# Patient Record
Sex: Female | Born: 1965 | Race: White | Hispanic: No | Marital: Married | Smoking: Never smoker
Health system: Southern US, Community
[De-identification: ages and names within clinical notes are randomized; demographics above are authoritative.]

## PROBLEM LIST (undated history)

## (undated) DIAGNOSIS — C50919 Malignant neoplasm of unspecified site of unspecified female breast: Secondary | ICD-10-CM

## (undated) DIAGNOSIS — C801 Malignant (primary) neoplasm, unspecified: Secondary | ICD-10-CM

## (undated) DIAGNOSIS — Z9221 Personal history of antineoplastic chemotherapy: Secondary | ICD-10-CM

## (undated) DIAGNOSIS — Z923 Personal history of irradiation: Secondary | ICD-10-CM

## (undated) HISTORY — PX: BREAST ENHANCEMENT SURGERY: SHX7

## (undated) HISTORY — DX: Malignant (primary) neoplasm, unspecified: C80.1

## (undated) HISTORY — PX: AUGMENTATION MAMMAPLASTY: SUR837

## (undated) HISTORY — DX: Malignant neoplasm of unspecified site of unspecified female breast: C50.919

---

## 1995-05-12 HISTORY — PX: TUBAL LIGATION: SHX77

## 2004-12-09 ENCOUNTER — Encounter: Payer: Self-pay | Admitting: Unknown Physician Specialty

## 2006-04-21 ENCOUNTER — Ambulatory Visit: Payer: Self-pay

## 2006-04-26 ENCOUNTER — Ambulatory Visit: Payer: Self-pay

## 2012-02-03 ENCOUNTER — Ambulatory Visit: Payer: Self-pay | Admitting: Family Medicine

## 2013-05-11 DIAGNOSIS — C801 Malignant (primary) neoplasm, unspecified: Secondary | ICD-10-CM

## 2013-05-11 DIAGNOSIS — C50919 Malignant neoplasm of unspecified site of unspecified female breast: Secondary | ICD-10-CM

## 2013-05-11 HISTORY — DX: Malignant neoplasm of unspecified site of unspecified female breast: C50.919

## 2013-05-11 HISTORY — PX: BREAST BIOPSY: SHX20

## 2013-05-11 HISTORY — DX: Malignant (primary) neoplasm, unspecified: C80.1

## 2013-07-05 ENCOUNTER — Ambulatory Visit: Payer: Self-pay | Admitting: Family Medicine

## 2013-07-10 ENCOUNTER — Ambulatory Visit: Payer: Self-pay | Admitting: Family Medicine

## 2013-07-14 LAB — PATHOLOGY REPORT

## 2013-07-17 ENCOUNTER — Other Ambulatory Visit: Payer: BC Managed Care – PPO

## 2013-07-17 ENCOUNTER — Ambulatory Visit (INDEPENDENT_AMBULATORY_CARE_PROVIDER_SITE_OTHER): Payer: BC Managed Care – PPO | Admitting: General Surgery

## 2013-07-17 ENCOUNTER — Encounter: Payer: Self-pay | Admitting: General Surgery

## 2013-07-17 ENCOUNTER — Other Ambulatory Visit: Payer: Self-pay | Admitting: General Surgery

## 2013-07-17 ENCOUNTER — Ambulatory Visit: Payer: Self-pay | Admitting: Hematology and Oncology

## 2013-07-17 VITALS — BP 140/76 | Ht 59.0 in | Wt 150.0 lb

## 2013-07-17 DIAGNOSIS — Z853 Personal history of malignant neoplasm of breast: Secondary | ICD-10-CM | POA: Insufficient documentation

## 2013-07-17 DIAGNOSIS — C50919 Malignant neoplasm of unspecified site of unspecified female breast: Secondary | ICD-10-CM

## 2013-07-17 NOTE — Patient Instructions (Signed)
Patient to have labs drawn at [facility] lab today.  This patient has also been scheduled for a PET scan at [facility] for 07-20-13 at 8 am (arrive 7:45 am). She is aware of all instructions.  Patient's surgery has been scheduled for 07-24-13 at [facility].

## 2013-07-17 NOTE — Progress Notes (Signed)
Patient ID: Dawn Rivas, female   DOB: 1965-09-04, 48 y.o.   MRN: [id number]  Chief Complaint  Patient presents with  . Other    breast cancer    HPI Dawn Rivas is a 48 y.o. female  Here due to being diagnosed with breast cancer. She found the mass in her left breast during her monthly self breast exam.  She had a mammogram done at [facility] on 07/05/13, then a subsuquent biopsy of the left breast on 07/10/13. She has been referred here by Dolores Frame NP for surgical consult. She denies any pain or discharge from the breasts.  The patient is accompanied by her husband 18 months, Gene Wingard as well as her parents.  The patient reports no pain or discomfort of the site. There is no history of trauma.  She is the mother of 3 boys.  Sallye Lat, M.D. In [place] placed retropectoral prostheses 7 years ago.   HPI  No past medical history on file.  Past Surgical History  Procedure Laterality Date  . Tubal ligation  1997  . Breast enhancement surgery  7 years ago    Family History  Problem Relation Age of Onset  . Breast cancer Maternal Aunt   . Cervical cancer Mother   . Lung cancer Maternal Grandfather     Social History History  Substance Use Topics  . Smoking status: Never Smoker   . Smokeless tobacco: Never Used  . Alcohol Use: Yes    Allergies  Allergen Reactions  . Penicillins Hives  . Sulfur Hives    Current Outpatient Prescriptions  Medication Sig Dispense Refill  . ALPRAZolam (XANAX) 0.5 MG tablet Take 0.5 mg by mouth 2 (two) times daily as needed.        No current facility-administered medications for this visit.    Review of Systems Review of Systems  Constitutional: Negative.   Respiratory: Negative.   Cardiovascular: Negative.     Blood pressure 140/76, height _0  (1.499 m), weight 150 lb (68.04 kg).  Physical Exam Physical Exam  Constitutional: She is oriented to person, place, and time. She appears well-developed and well-nourished.  Neck:  Normal range of motion. Neck supple.  Cardiovascular: Normal rate, regular rhythm and normal heart sounds.   Pulmonary/Chest: Effort normal and breath sounds normal. Right breast exhibits no inverted nipple, no mass, no nipple discharge, no skin change and no tenderness. Left breast exhibits mass (2 cm mass 8 o'clock, 7 cm from the nipple). Left breast exhibits no inverted nipple, no nipple discharge, no skin change and no tenderness.    Lymphadenopathy:    She has axillary adenopathy.       Right axillary: No pectoral adenopathy present.       Left axillary: Pectoral adenopathy present.  Neurological: She is alert and oriented to person, place, and time.    Data Reviewed The primary tumor measures 1.4 x 2.1 x 2.26 cm in the left breast at the 8 o'clock position, 7 cm from the nipple. This abuts the underlying pectoralis fascia but does not move through the pectoralis muscle.  The left axilla shows a 1.97 x 2.4 x 2.9 cm mass consistent with a abnormal lymph gland.  Pathology showed invasive mammary carcinoma in both lesions. ER/TR/HER-2/neu negative.Laboratory studies completed by Dolores Frame, NP. On June 23, 2013 showed a hemoglobin of 15.5, white blood cell count 6000 with 54% polys, 40% lymphocytes, comprehensive metabolic panel obtained fasting showed a creatinine of 0.89 with an estimated GFR of 76,  normal electrolytes, normal liver function studies.  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       Plan    After the patient's exam I spoke with her husband and her parents. This is easily stage II disease with a large axillary lymph node. Triple negative tumors ward strong consideration for neoadjuvant chemotherapy. Considering the large node in the left axilla staging with PET/CT is appropriate. CEA and CA 27-29 will be obtained.  As she is under 71 and has a family history of breast cancer in maternal aunt she is a candidate for genetic screening  for BRCA mutations.  The role of adjuvant chemotherapy was discussed and the need for central venous access to successfully complete this was reviewed. The risks of power port placement including those related to bleeding and lung injury were discussed.  We'll arrange for medical oncology evaluation as well as PET scan in the near future. Arrangements have been made for power port placement on July 24, 2013.      Patient to have the following labs drawn at [facility] lab today: CEA and CA 27-29.  An appointment has been arranged for patient to see Dr. Kallie Edward at the [facility] for 07-18-13 at 9 am (arrive 8:30 am).  This patient has also been scheduled for a PET scan at [facility] for 07-20-13 at 8 am (arrive 7:45 am). She is aware of all instructions.  Patient's surgery has been scheduled for 07-24-13 at [facility].   Robert Bellow 07/17/2013, 7:21 PM

## 2013-07-18 ENCOUNTER — Ambulatory Visit: Payer: Self-pay | Admitting: Hematology and Oncology

## 2013-07-18 LAB — CEA: CEA: 1.4 ng/mL (ref 0.0–4.7)

## 2013-07-18 LAB — CANCER ANTIGEN 27.29: CA 27.29: 21.4 U/mL (ref 0.0–38.6)

## 2013-07-20 ENCOUNTER — Ambulatory Visit: Payer: Self-pay | Admitting: General Surgery

## 2013-07-24 ENCOUNTER — Ambulatory Visit: Payer: Self-pay | Admitting: General Surgery

## 2013-07-24 ENCOUNTER — Encounter: Payer: Self-pay | Admitting: General Surgery

## 2013-07-24 DIAGNOSIS — C50919 Malignant neoplasm of unspecified site of unspecified female breast: Secondary | ICD-10-CM

## 2013-07-24 HISTORY — PX: PORTACATH PLACEMENT: SHX2246

## 2013-07-26 ENCOUNTER — Encounter: Payer: Self-pay | Admitting: General Surgery

## 2013-07-28 LAB — CBC CANCER CENTER
BASOS PCT: 0.6 %
Basophil #: 0 x10 3/mm (ref 0.0–0.1)
EOS PCT: 2.2 %
Eosinophil #: 0.1 x10 3/mm (ref 0.0–0.7)
HCT: 42.2 % (ref 35.0–47.0)
HGB: 14.3 g/dL (ref 12.0–16.0)
LYMPHS ABS: 2.7 x10 3/mm (ref 1.0–3.6)
Lymphocyte %: 43.8 %
MCH: 31.2 pg (ref 26.0–34.0)
MCHC: 33.9 g/dL (ref 32.0–36.0)
MCV: 92 fL (ref 80–100)
Monocyte #: 0.4 x10 3/mm (ref 0.2–0.9)
Monocyte %: 6.5 %
Neutrophil #: 2.8 x10 3/mm (ref 1.4–6.5)
Neutrophil %: 46.9 %
Platelet: 176 x10 3/mm (ref 150–440)
RBC: 4.59 10*6/uL (ref 3.80–5.20)
RDW: 12.5 % (ref 11.5–14.5)
WBC: 6.1 x10 3/mm (ref 3.6–11.0)

## 2013-07-28 LAB — COMPREHENSIVE METABOLIC PANEL
ANION GAP: 10 (ref 7–16)
Albumin: 3.9 g/dL (ref 3.4–5.0)
Alkaline Phosphatase: 52 U/L
BUN: 18 mg/dL (ref 7–18)
Bilirubin,Total: 0.4 mg/dL (ref 0.2–1.0)
CREATININE: 1.01 mg/dL (ref 0.60–1.30)
Calcium, Total: 9 mg/dL (ref 8.5–10.1)
Chloride: 103 mmol/L (ref 98–107)
Co2: 27 mmol/L (ref 21–32)
EGFR (Non-African Amer.): >60
Glucose: 108 mg/dL — ABNORMAL HIGH (ref 65–99)
OSMOLALITY: 282 (ref 275–301)
Potassium: 3.9 mmol/L (ref 3.5–5.1)
SGOT(AST): 23 U/L (ref 15–37)
SGPT (ALT): 41 U/L (ref 12–78)
SODIUM: 140 mmol/L (ref 136–145)
Total Protein: 7.6 g/dL (ref 6.4–8.2)

## 2013-08-01 ENCOUNTER — Encounter: Payer: Self-pay | Admitting: General Surgery

## 2013-08-04 LAB — CBC CANCER CENTER
BASOS PCT: 0.7 %
Basophil #: 0 x10 3/mm (ref 0.0–0.1)
EOS ABS: 0.1 x10 3/mm (ref 0.0–0.7)
Eosinophil %: 2.3 %
HCT: 40.5 % (ref 35.0–47.0)
HGB: 13.8 g/dL (ref 12.0–16.0)
LYMPHS ABS: 1.7 x10 3/mm (ref 1.0–3.6)
LYMPHS PCT: 48.7 %
MCH: 31.3 pg (ref 26.0–34.0)
MCHC: 34 g/dL (ref 32.0–36.0)
MCV: 92 fL (ref 80–100)
Monocyte #: 0 x10 3/mm — ABNORMAL LOW (ref 0.2–0.9)
Monocyte %: 1.3 %
NEUTROS ABS: 1.6 x10 3/mm (ref 1.4–6.5)
Neutrophil %: 47 %
Platelet: 192 x10 3/mm (ref 150–440)
RBC: 4.41 10*6/uL (ref 3.80–5.20)
RDW: 11.8 % (ref 11.5–14.5)
WBC: 3.5 x10 3/mm — ABNORMAL LOW (ref 3.6–11.0)

## 2013-08-09 ENCOUNTER — Ambulatory Visit: Payer: Self-pay | Admitting: Hematology and Oncology

## 2013-08-11 LAB — CBC CANCER CENTER
Basophil #: 0 x10 3/mm (ref 0.0–0.1)
Basophil %: 1.5 %
Eosinophil #: 0 x10 3/mm (ref 0.0–0.7)
Eosinophil %: 1 %
HCT: 40.4 % (ref 35.0–47.0)
HGB: 13.9 g/dL (ref 12.0–16.0)
LYMPHS PCT: 68.7 %
Lymphocyte #: 1.4 x10 3/mm (ref 1.0–3.6)
MCH: 31.7 pg (ref 26.0–34.0)
MCHC: 34.3 g/dL (ref 32.0–36.0)
MCV: 93 fL (ref 80–100)
MONOS PCT: 18.7 %
Monocyte #: 0.4 x10 3/mm (ref 0.2–0.9)
NEUTROS PCT: 10.1 %
Neutrophil #: 0.2 x10 3/mm — ABNORMAL LOW (ref 1.4–6.5)
Platelet: 228 x10 3/mm (ref 150–440)
RBC: 4.37 10*6/uL (ref 3.80–5.20)
RDW: 12 % (ref 11.5–14.5)
WBC: 2 x10 3/mm — CL (ref 3.6–11.0)

## 2013-08-18 LAB — CBC CANCER CENTER
BASOS ABS: 0.1 x10 3/mm (ref 0.0–0.1)
BASOS PCT: 1.1 %
Eosinophil #: 0 x10 3/mm (ref 0.0–0.7)
Eosinophil %: 0.3 %
HCT: 40.5 % (ref 35.0–47.0)
HGB: 13.9 g/dL (ref 12.0–16.0)
LYMPHS ABS: 2.1 x10 3/mm (ref 1.0–3.6)
LYMPHS PCT: 37.1 %
MCH: 31.7 pg (ref 26.0–34.0)
MCHC: 34.2 g/dL (ref 32.0–36.0)
MCV: 93 fL (ref 80–100)
MONO ABS: 0.7 x10 3/mm (ref 0.2–0.9)
MONOS PCT: 11.8 %
NEUTROS ABS: 2.8 x10 3/mm (ref 1.4–6.5)
NEUTROS PCT: 49.7 %
PLATELETS: 279 x10 3/mm (ref 150–440)
RBC: 4.37 10*6/uL (ref 3.80–5.20)
RDW: 12.5 % (ref 11.5–14.5)
WBC: 5.7 x10 3/mm (ref 3.6–11.0)

## 2013-08-18 LAB — BASIC METABOLIC PANEL
ANION GAP: 9 (ref 7–16)
BUN: 14 mg/dL (ref 7–18)
CHLORIDE: 103 mmol/L (ref 98–107)
CREATININE: 0.92 mg/dL (ref 0.60–1.30)
Calcium, Total: 9.2 mg/dL (ref 8.5–10.1)
Co2: 27 mmol/L (ref 21–32)
GLUCOSE: 98 mg/dL (ref 65–99)
Osmolality: 278 (ref 275–301)
Potassium: 4.1 mmol/L (ref 3.5–5.1)
Sodium: 139 mmol/L (ref 136–145)

## 2013-08-25 LAB — CBC CANCER CENTER
BASOS ABS: 0 x10 3/mm (ref 0.0–0.1)
Basophil %: 1.1 %
Eosinophil #: 0 x10 3/mm (ref 0.0–0.7)
Eosinophil %: 1 %
HCT: 38.4 % (ref 35.0–47.0)
HGB: 13.1 g/dL (ref 12.0–16.0)
LYMPHS ABS: 0.9 x10 3/mm — AB (ref 1.0–3.6)
Lymphocyte %: 29.9 %
MCH: 31.2 pg (ref 26.0–34.0)
MCHC: 34 g/dL (ref 32.0–36.0)
MCV: 92 fL (ref 80–100)
MONO ABS: 0.1 x10 3/mm — AB (ref 0.2–0.9)
Monocyte %: 2.4 %
NEUTROS ABS: 1.9 x10 3/mm (ref 1.4–6.5)
NEUTROS PCT: 65.6 %
Platelet: 170 x10 3/mm (ref 150–440)
RBC: 4.18 10*6/uL (ref 3.80–5.20)
RDW: 12.1 % (ref 11.5–14.5)
WBC: 2.9 x10 3/mm — AB (ref 3.6–11.0)

## 2013-09-01 LAB — CBC CANCER CENTER
Basophil #: 0 x10 3/mm (ref 0.0–0.1)
Basophil %: 1.3 %
EOS PCT: 1.8 %
Eosinophil #: 0 x10 3/mm (ref 0.0–0.7)
HCT: 38 % (ref 35.0–47.0)
HGB: 13 g/dL (ref 12.0–16.0)
Lymphocyte #: 1.2 x10 3/mm (ref 1.0–3.6)
Lymphocyte %: 58.7 %
MCH: 31.7 pg (ref 26.0–34.0)
MCHC: 34.1 g/dL (ref 32.0–36.0)
MCV: 93 fL (ref 80–100)
Monocyte #: 0.4 x10 3/mm (ref 0.2–0.9)
Monocyte %: 22.6 %
NEUTROS ABS: 0.3 x10 3/mm — AB (ref 1.4–6.5)
Neutrophil %: 15.6 %
Platelet: 211 x10 3/mm (ref 150–440)
RBC: 4.1 10*6/uL (ref 3.80–5.20)
RDW: 12.7 % (ref 11.5–14.5)
WBC: 2 x10 3/mm — AB (ref 3.6–11.0)

## 2013-09-08 ENCOUNTER — Ambulatory Visit: Payer: Self-pay | Admitting: Hematology and Oncology

## 2013-09-08 LAB — BASIC METABOLIC PANEL
ANION GAP: 10 (ref 7–16)
BUN: 14 mg/dL (ref 7–18)
Calcium, Total: 8.9 mg/dL (ref 8.5–10.1)
Chloride: 105 mmol/L (ref 98–107)
Co2: 27 mmol/L (ref 21–32)
Creatinine: 0.97 mg/dL (ref 0.60–1.30)
EGFR (African American): >60
GLUCOSE: 106 mg/dL — AB (ref 65–99)
Osmolality: 284 (ref 275–301)
POTASSIUM: 4 mmol/L (ref 3.5–5.1)
SODIUM: 142 mmol/L (ref 136–145)

## 2013-09-08 LAB — CBC CANCER CENTER
Basophil #: 0.1 x10 3/mm (ref 0.0–0.1)
Basophil %: 1.7 %
EOS ABS: 0 x10 3/mm (ref 0.0–0.7)
Eosinophil %: 0.4 %
HCT: 36.8 % (ref 35.0–47.0)
HGB: 13 g/dL (ref 12.0–16.0)
Lymphocyte #: 1.6 x10 3/mm (ref 1.0–3.6)
Lymphocyte %: 38 %
MCH: 32 pg (ref 26.0–34.0)
MCHC: 35.4 g/dL (ref 32.0–36.0)
MCV: 90 fL (ref 80–100)
MONO ABS: 0.5 x10 3/mm (ref 0.2–0.9)
MONOS PCT: 13 %
NEUTROS ABS: 1.9 x10 3/mm (ref 1.4–6.5)
Neutrophil %: 46.9 %
Platelet: 249 x10 3/mm (ref 150–440)
RBC: 4.07 10*6/uL (ref 3.80–5.20)
RDW: 13.3 % (ref 11.5–14.5)
WBC: 4.2 x10 3/mm (ref 3.6–11.0)

## 2013-09-11 LAB — BASIC METABOLIC PANEL
ANION GAP: 13 (ref 7–16)
BUN: 17 mg/dL (ref 7–18)
CHLORIDE: 99 mmol/L (ref 98–107)
CO2: 27 mmol/L (ref 21–32)
CREATININE: 0.89 mg/dL (ref 0.60–1.30)
Calcium, Total: 9.1 mg/dL (ref 8.5–10.1)
EGFR (African American): >60
Glucose: 127 mg/dL — ABNORMAL HIGH (ref 65–99)
OSMOLALITY: 281 (ref 275–301)
Potassium: 3.6 mmol/L (ref 3.5–5.1)
Sodium: 139 mmol/L (ref 136–145)

## 2013-09-15 LAB — CBC CANCER CENTER
BASOS PCT: 0.8 %
Basophil #: 0 x10 3/mm (ref 0.0–0.1)
EOS ABS: 0 x10 3/mm (ref 0.0–0.7)
Eosinophil %: 1.2 %
HCT: 36.8 % (ref 35.0–47.0)
HGB: 12.8 g/dL (ref 12.0–16.0)
LYMPHS PCT: 27.2 %
Lymphocyte #: 0.9 x10 3/mm — ABNORMAL LOW (ref 1.0–3.6)
MCH: 31.7 pg (ref 26.0–34.0)
MCHC: 34.8 g/dL (ref 32.0–36.0)
MCV: 91 fL (ref 80–100)
MONOS PCT: 1.7 %
Monocyte #: 0.1 x10 3/mm — ABNORMAL LOW (ref 0.2–0.9)
Neutrophil #: 2.4 x10 3/mm (ref 1.4–6.5)
Neutrophil %: 69.1 %
PLATELETS: 173 x10 3/mm (ref 150–440)
RBC: 4.04 10*6/uL (ref 3.80–5.20)
RDW: 12.8 % (ref 11.5–14.5)
WBC: 3.4 x10 3/mm — ABNORMAL LOW (ref 3.6–11.0)

## 2013-09-15 LAB — BASIC METABOLIC PANEL
ANION GAP: 10 (ref 7–16)
BUN: 16 mg/dL (ref 7–18)
CO2: 26 mmol/L (ref 21–32)
Calcium, Total: 9.5 mg/dL (ref 8.5–10.1)
Chloride: 105 mmol/L (ref 98–107)
Creatinine: 0.76 mg/dL (ref 0.60–1.30)
EGFR (African American): >60
EGFR (Non-African Amer.): >60
GLUCOSE: 128 mg/dL — AB (ref 65–99)
Osmolality: 284 (ref 275–301)
Potassium: 3.4 mmol/L — ABNORMAL LOW (ref 3.5–5.1)
SODIUM: 141 mmol/L (ref 136–145)

## 2013-09-22 LAB — CBC CANCER CENTER
BASOS ABS: 0 x10 3/mm (ref 0.0–0.1)
Basophil %: 2.2 %
EOS ABS: 0 x10 3/mm (ref 0.0–0.7)
EOS PCT: 2 %
HCT: 34 % — ABNORMAL LOW (ref 35.0–47.0)
HGB: 12.2 g/dL (ref 12.0–16.0)
LYMPHS ABS: 0.8 x10 3/mm — AB (ref 1.0–3.6)
Lymphocyte %: 51.4 %
MCH: 32.5 pg (ref 26.0–34.0)
MCHC: 35.8 g/dL (ref 32.0–36.0)
MCV: 91 fL (ref 80–100)
MONOS PCT: 24.7 %
Monocyte #: 0.4 x10 3/mm (ref 0.2–0.9)
Neutrophil #: 0.3 x10 3/mm — ABNORMAL LOW (ref 1.4–6.5)
Neutrophil %: 19.7 %
Platelet: 159 x10 3/mm (ref 150–440)
RBC: 3.75 10*6/uL — ABNORMAL LOW (ref 3.80–5.20)
RDW: 14.1 % (ref 11.5–14.5)
WBC: 1.6 x10 3/mm — CL (ref 3.6–11.0)

## 2013-09-26 ENCOUNTER — Encounter: Payer: Self-pay | Admitting: General Surgery

## 2013-09-26 ENCOUNTER — Ambulatory Visit (INDEPENDENT_AMBULATORY_CARE_PROVIDER_SITE_OTHER): Payer: BC Managed Care – PPO | Admitting: General Surgery

## 2013-09-26 ENCOUNTER — Other Ambulatory Visit: Payer: BC Managed Care – PPO

## 2013-09-26 VITALS — BP 142/86 | Ht 59.0 in | Wt 145.0 lb

## 2013-09-26 DIAGNOSIS — N63 Unspecified lump in unspecified breast: Secondary | ICD-10-CM

## 2013-09-26 DIAGNOSIS — C50919 Malignant neoplasm of unspecified site of unspecified female breast: Secondary | ICD-10-CM

## 2013-09-26 NOTE — Patient Instructions (Signed)
The patient is aware to call back for any questions or concerns.  

## 2013-09-26 NOTE — Progress Notes (Signed)
Patient ID: Dawn Rivas, female   DOB: 08-Jul-1965, 48 y.o.   MRN: [id number]  Chief Complaint  Patient presents with  . Routine Post Op    2 month follow up port placement    HPI Dawn Rivas is a 48 y.o. female who presents for a 2 month follow up port placement and initiate of AC chemotherapy.  The patient states she is not tolerating chemotherapy well so an Upper GI was advised. She has had 3 treatments so far. The patient states after chemotherapy treatments she has vomiting. She has had to go and have fluids twice for dehydration.  Her symptoms of nausea and vomiting 48 hours after treatment has progressively escalated. There is some question of whether she'll receive her fourth planned a cycle of AC.  Patient is going for an Upper Endoscopy on Thursday so her chemotherapy is on hold right now.   HPI  Past Medical History  Diagnosis Date  . Cancer 2015    left breast    Past Surgical History  Procedure Laterality Date  . Tubal ligation  1997  . Breast enhancement surgery  7 years ago  . Portacath placement  07/24/13  . Breast biopsy Left 2015    Family History  Problem Relation Age of Onset  . Breast cancer Maternal Aunt   . Cervical cancer Mother   . Lung cancer Maternal Grandfather     Social History History  Substance Use Topics  . Smoking status: Never Smoker   . Smokeless tobacco: Never Used  . Alcohol Use: Yes    Allergies  Allergen Reactions  . Penicillins Hives  . Sulfur Hives    Current Outpatient Prescriptions  Medication Sig Dispense Refill  . ALPRAZolam (XANAX) 0.5 MG tablet Take 0.5 mg by mouth 2 (two) times daily as needed.       Marland Kitchen omeprazole (PRILOSEC) 20 MG capsule Take 1 capsule by mouth daily.       No current facility-administered medications for this visit.    Review of Systems Review of Systems  Constitutional: Negative.   Respiratory: Negative.   Cardiovascular: Negative.     Blood pressure 142/86, height 4\' 11"  (1.499 m), weight  145 lb (65.772 kg).  Physical Exam Physical Exam  Constitutional: She is oriented to person, place, and time. She appears well-developed and well-nourished.  Neck: Neck supple. No thyromegaly present.  Cardiovascular: Normal rate, regular rhythm and normal heart sounds.   No murmur heard. Pulmonary/Chest: Effort normal and breath sounds normal. Right breast exhibits no inverted nipple, no mass, no nipple discharge, no skin change and no tenderness. Left breast exhibits no inverted nipple, no mass, no nipple discharge, no skin change and no tenderness.    Lymphadenopathy:    She has no cervical adenopathy.    She has no axillary adenopathy.  Neurological: She is alert and oriented to person, place, and time.  Skin: Skin is warm and dry.    Data Reviewed Ultrasound was planned as discussion with medical oncology today suggested if there was indeed a pronounced response to her already administered chemotherapy, the fourth planned cycle might be omitted.  Ultrasound examination of the primary site in the lower inner quadrant of the left breast, 8 o'clock position 7 cm the nipple showed a small 0.81 cm residual nodular area abutting the underlying pectoralis fascia best appreciated on the anti-radial views.  Scanning through the axilla showed a residual 0.7 x 0.9 x 1.03 cm lymph node with loss of normal architecture. This  is significantly decreased in size since neoadjuvant treatment was begun.  Assessment    Excellent clinical response to neoadjuvant chemotherapy.  Progressive/escalating nausea and vomiting with chemotherapy.     Plan    The patient will meet with Dr. Kallie Edward later this week for further treatment planning. She's had an excellent response today. If she goes on to complete her Taxol therapy, we'll get together afterwards to discuss surgical excision. Based on the tremendous response already noted, the patient will benefit from needle localization when it is time for wide  excision. Considering the biopsy-proven nodal metastasis and some residual nodal prominence on today's ultrasound, sentinel lymph node biopsy will be indicated to be followed by axillary dissection if residual macro metastatic disease is appreciated.     PCP: Lelan Pons Byrnett 09/26/2013, 9:19 PM

## 2013-09-28 ENCOUNTER — Ambulatory Visit: Payer: Self-pay | Admitting: Gastroenterology

## 2013-09-28 HISTORY — PX: UPPER GI ENDOSCOPY: SHX6162

## 2013-09-29 LAB — CBC CANCER CENTER
Basophil #: 0.1 x10 3/mm (ref 0.0–0.1)
Basophil %: 1.6 %
EOS PCT: 0.5 %
Eosinophil #: 0 x10 3/mm (ref 0.0–0.7)
HCT: 37.5 % (ref 35.0–47.0)
HGB: 13.4 g/dL (ref 12.0–16.0)
Lymphocyte #: 1.2 x10 3/mm (ref 1.0–3.6)
Lymphocyte %: 28.6 %
MCH: 32.4 pg (ref 26.0–34.0)
MCHC: 35.7 g/dL (ref 32.0–36.0)
MCV: 91 fL (ref 80–100)
MONO ABS: 0.6 x10 3/mm (ref 0.2–0.9)
MONOS PCT: 13.7 %
NEUTROS ABS: 2.3 x10 3/mm (ref 1.4–6.5)
Neutrophil %: 55.6 %
Platelet: 270 x10 3/mm (ref 150–440)
RBC: 4.14 10*6/uL (ref 3.80–5.20)
RDW: 15 % — AB (ref 11.5–14.5)
WBC: 4.1 x10 3/mm (ref 3.6–11.0)

## 2013-09-29 LAB — BASIC METABOLIC PANEL
ANION GAP: 8 (ref 7–16)
BUN: 12 mg/dL (ref 7–18)
CHLORIDE: 104 mmol/L (ref 98–107)
Calcium, Total: 8.8 mg/dL (ref 8.5–10.1)
Co2: 30 mmol/L (ref 21–32)
Creatinine: 0.79 mg/dL (ref 0.60–1.30)
EGFR (African American): >60
EGFR (Non-African Amer.): >60
GLUCOSE: 102 mg/dL — AB (ref 65–99)
OSMOLALITY: 283 (ref 275–301)
Potassium: 4.4 mmol/L (ref 3.5–5.1)
SODIUM: 142 mmol/L (ref 136–145)

## 2013-10-06 LAB — CBC CANCER CENTER
Basophil #: 0.1 x10 3/mm (ref 0.0–0.1)
Basophil %: 2.7 %
Eosinophil #: 0.1 x10 3/mm (ref 0.0–0.7)
Eosinophil %: 1.3 %
HCT: 35.5 % (ref 35.0–47.0)
HGB: 12.3 g/dL (ref 12.0–16.0)
Lymphocyte #: 1.1 x10 3/mm (ref 1.0–3.6)
Lymphocyte %: 27.3 %
MCH: 32.4 pg (ref 26.0–34.0)
MCHC: 34.7 g/dL (ref 32.0–36.0)
MCV: 93 fL (ref 80–100)
MONO ABS: 0.4 x10 3/mm (ref 0.2–0.9)
MONOS PCT: 10.9 %
NEUTROS ABS: 2.3 x10 3/mm (ref 1.4–6.5)
Neutrophil %: 57.8 %
Platelet: 181 x10 3/mm (ref 150–440)
RBC: 3.8 10*6/uL (ref 3.80–5.20)
RDW: 14.9 % — AB (ref 11.5–14.5)
WBC: 4 x10 3/mm (ref 3.6–11.0)

## 2013-10-09 ENCOUNTER — Ambulatory Visit: Payer: Self-pay | Admitting: Hematology and Oncology

## 2013-10-13 LAB — CBC CANCER CENTER
Basophil #: 0.1 x10 3/mm (ref 0.0–0.1)
Basophil %: 0.9 %
Eosinophil #: 0.2 x10 3/mm (ref 0.0–0.7)
Eosinophil %: 3.2 %
HCT: 35.4 % (ref 35.0–47.0)
HGB: 12.4 g/dL (ref 12.0–16.0)
LYMPHS ABS: 1.9 x10 3/mm (ref 1.0–3.6)
Lymphocyte %: 32.4 %
MCH: 32.6 pg (ref 26.0–34.0)
MCHC: 35 g/dL (ref 32.0–36.0)
MCV: 93 fL (ref 80–100)
MONO ABS: 0.4 x10 3/mm (ref 0.2–0.9)
Monocyte %: 6.9 %
Neutrophil #: 3.3 x10 3/mm (ref 1.4–6.5)
Neutrophil %: 56.6 %
Platelet: 155 x10 3/mm (ref 150–440)
RBC: 3.8 10*6/uL (ref 3.80–5.20)
RDW: 15.1 % — ABNORMAL HIGH (ref 11.5–14.5)
WBC: 5.9 x10 3/mm (ref 3.6–11.0)

## 2013-10-13 LAB — COMPREHENSIVE METABOLIC PANEL
ALBUMIN: 3.6 g/dL (ref 3.4–5.0)
ALK PHOS: 39 U/L — AB
ALT: 33 U/L (ref 12–78)
AST: 16 U/L (ref 15–37)
Anion Gap: 7 (ref 7–16)
BILIRUBIN TOTAL: 0.4 mg/dL (ref 0.2–1.0)
BUN: 17 mg/dL (ref 7–18)
CREATININE: 0.86 mg/dL (ref 0.60–1.30)
Calcium, Total: 9.2 mg/dL (ref 8.5–10.1)
Chloride: 103 mmol/L (ref 98–107)
Co2: 30 mmol/L (ref 21–32)
EGFR (African American): >60
EGFR (Non-African Amer.): >60
GLUCOSE: 92 mg/dL (ref 65–99)
Osmolality: 281 (ref 275–301)
POTASSIUM: 4.2 mmol/L (ref 3.5–5.1)
Sodium: 140 mmol/L (ref 136–145)
TOTAL PROTEIN: 6.8 g/dL (ref 6.4–8.2)

## 2013-10-20 LAB — CBC CANCER CENTER
Basophil #: 0.1 x10 3/mm (ref 0.0–0.1)
Basophil %: 1 %
Eosinophil #: 0.2 x10 3/mm (ref 0.0–0.7)
Eosinophil %: 3 %
HCT: 35.4 % (ref 35.0–47.0)
HGB: 12.4 g/dL (ref 12.0–16.0)
LYMPHS PCT: 21.1 %
Lymphocyte #: 1.1 x10 3/mm (ref 1.0–3.6)
MCH: 32.9 pg (ref 26.0–34.0)
MCHC: 35 g/dL (ref 32.0–36.0)
MCV: 94 fL (ref 80–100)
MONO ABS: 0.4 x10 3/mm (ref 0.2–0.9)
Monocyte %: 7 %
Neutrophil #: 3.7 x10 3/mm (ref 1.4–6.5)
Neutrophil %: 67.9 %
Platelet: 192 x10 3/mm (ref 150–440)
RBC: 3.76 10*6/uL — ABNORMAL LOW (ref 3.80–5.20)
RDW: 15.3 % — ABNORMAL HIGH (ref 11.5–14.5)
WBC: 5.4 x10 3/mm (ref 3.6–11.0)

## 2013-10-27 LAB — CBC CANCER CENTER
BASOS ABS: 0 x10 3/mm (ref 0.0–0.1)
BASOS PCT: 0.3 %
EOS PCT: 2.7 %
Eosinophil #: 0.1 x10 3/mm (ref 0.0–0.7)
HCT: 36.3 % (ref 35.0–47.0)
HGB: 12.5 g/dL (ref 12.0–16.0)
LYMPHS ABS: 1.2 x10 3/mm (ref 1.0–3.6)
Lymphocyte %: 27.8 %
MCH: 32.8 pg (ref 26.0–34.0)
MCHC: 34.6 g/dL (ref 32.0–36.0)
MCV: 95 fL (ref 80–100)
MONOS PCT: 7.1 %
Monocyte #: 0.3 x10 3/mm (ref 0.2–0.9)
NEUTROS PCT: 62.1 %
Neutrophil #: 2.7 x10 3/mm (ref 1.4–6.5)
Platelet: 218 x10 3/mm (ref 150–440)
RBC: 3.82 10*6/uL (ref 3.80–5.20)
RDW: 15.7 % — ABNORMAL HIGH (ref 11.5–14.5)
WBC: 4.3 x10 3/mm (ref 3.6–11.0)

## 2013-10-27 LAB — COMPREHENSIVE METABOLIC PANEL
ALK PHOS: 46 U/L
ANION GAP: 8 (ref 7–16)
Albumin: 3.8 g/dL (ref 3.4–5.0)
BILIRUBIN TOTAL: 0.4 mg/dL (ref 0.2–1.0)
BUN: 15 mg/dL (ref 7–18)
CREATININE: 0.78 mg/dL (ref 0.60–1.30)
Calcium, Total: 9.2 mg/dL (ref 8.5–10.1)
Chloride: 104 mmol/L (ref 98–107)
Co2: 29 mmol/L (ref 21–32)
EGFR (African American): >60
EGFR (Non-African Amer.): >60
Glucose: 115 mg/dL — ABNORMAL HIGH (ref 65–99)
Osmolality: 283 (ref 275–301)
POTASSIUM: 4 mmol/L (ref 3.5–5.1)
SGOT(AST): 18 U/L (ref 15–37)
SGPT (ALT): 44 U/L (ref 12–78)
SODIUM: 141 mmol/L (ref 136–145)
TOTAL PROTEIN: 7.4 g/dL (ref 6.4–8.2)

## 2013-11-03 LAB — CBC CANCER CENTER
Basophil #: 0 x10 3/mm (ref 0.0–0.1)
Basophil %: 0.9 %
Eosinophil #: 0.1 x10 3/mm (ref 0.0–0.7)
Eosinophil %: 2.7 %
HCT: 35 % (ref 35.0–47.0)
HGB: 12.2 g/dL (ref 12.0–16.0)
LYMPHS ABS: 1.2 x10 3/mm (ref 1.0–3.6)
Lymphocyte %: 30.4 %
MCH: 33.1 pg (ref 26.0–34.0)
MCHC: 35 g/dL (ref 32.0–36.0)
MCV: 95 fL (ref 80–100)
MONO ABS: 0.4 x10 3/mm (ref 0.2–0.9)
Monocyte %: 9.5 %
NEUTROS ABS: 2.3 x10 3/mm (ref 1.4–6.5)
NEUTROS PCT: 56.5 %
Platelet: 220 x10 3/mm (ref 150–440)
RBC: 3.7 10*6/uL — ABNORMAL LOW (ref 3.80–5.20)
RDW: 15.5 % — AB (ref 11.5–14.5)
WBC: 4.1 x10 3/mm (ref 3.6–11.0)

## 2013-11-08 ENCOUNTER — Ambulatory Visit: Payer: Self-pay | Admitting: Hematology and Oncology

## 2013-11-09 LAB — CBC CANCER CENTER
BASOS ABS: 0 x10 3/mm (ref 0.0–0.1)
Basophil %: 0.8 %
EOS PCT: 1.4 %
Eosinophil #: 0.1 x10 3/mm (ref 0.0–0.7)
HCT: 34.9 % — ABNORMAL LOW (ref 35.0–47.0)
HGB: 12.3 g/dL (ref 12.0–16.0)
Lymphocyte #: 1.4 x10 3/mm (ref 1.0–3.6)
Lymphocyte %: 28.2 %
MCH: 33.2 pg (ref 26.0–34.0)
MCHC: 35.2 g/dL (ref 32.0–36.0)
MCV: 94 fL (ref 80–100)
MONO ABS: 0.3 x10 3/mm (ref 0.2–0.9)
MONOS PCT: 5.5 %
Neutrophil #: 3.2 x10 3/mm (ref 1.4–6.5)
Neutrophil %: 64.1 %
Platelet: 236 x10 3/mm (ref 150–440)
RBC: 3.69 10*6/uL — ABNORMAL LOW (ref 3.80–5.20)
RDW: 15.4 % — ABNORMAL HIGH (ref 11.5–14.5)
WBC: 5 x10 3/mm (ref 3.6–11.0)

## 2013-11-17 LAB — CBC CANCER CENTER
BASOS PCT: 0.9 %
Basophil #: 0 x10 3/mm (ref 0.0–0.1)
EOS PCT: 1.4 %
Eosinophil #: 0.1 x10 3/mm (ref 0.0–0.7)
HCT: 35.7 % (ref 35.0–47.0)
HGB: 12.4 g/dL (ref 12.0–16.0)
LYMPHS ABS: 1.1 x10 3/mm (ref 1.0–3.6)
Lymphocyte %: 28.6 %
MCH: 33.2 pg (ref 26.0–34.0)
MCHC: 34.9 g/dL (ref 32.0–36.0)
MCV: 95 fL (ref 80–100)
MONO ABS: 0.3 x10 3/mm (ref 0.2–0.9)
Monocyte %: 7.6 %
NEUTROS PCT: 61.5 %
Neutrophil #: 2.3 x10 3/mm (ref 1.4–6.5)
PLATELETS: 221 x10 3/mm (ref 150–440)
RBC: 3.75 10*6/uL — ABNORMAL LOW (ref 3.80–5.20)
RDW: 15.4 % — ABNORMAL HIGH (ref 11.5–14.5)
WBC: 3.8 x10 3/mm (ref 3.6–11.0)

## 2013-11-24 LAB — CBC CANCER CENTER
Basophil #: 0 x10 3/mm (ref 0.0–0.1)
Basophil %: 1 %
EOS PCT: 2.1 %
Eosinophil #: 0.1 x10 3/mm (ref 0.0–0.7)
HCT: 34.8 % — ABNORMAL LOW (ref 35.0–47.0)
HGB: 12.1 g/dL (ref 12.0–16.0)
LYMPHS ABS: 1.3 x10 3/mm (ref 1.0–3.6)
Lymphocyte %: 29 %
MCH: 32.9 pg (ref 26.0–34.0)
MCHC: 34.6 g/dL (ref 32.0–36.0)
MCV: 95 fL (ref 80–100)
Monocyte #: 0.5 x10 3/mm (ref 0.2–0.9)
Monocyte %: 10.6 %
NEUTROS ABS: 2.5 x10 3/mm (ref 1.4–6.5)
NEUTROS PCT: 57.3 %
Platelet: 248 x10 3/mm (ref 150–440)
RBC: 3.67 10*6/uL — ABNORMAL LOW (ref 3.80–5.20)
RDW: 15.1 % — ABNORMAL HIGH (ref 11.5–14.5)
WBC: 4.4 x10 3/mm (ref 3.6–11.0)

## 2013-12-01 LAB — CBC CANCER CENTER
BASOS PCT: 0.8 %
Basophil #: 0 x10 3/mm (ref 0.0–0.1)
Eosinophil #: 0.1 x10 3/mm (ref 0.0–0.7)
Eosinophil %: 2.1 %
HCT: 36.4 % (ref 35.0–47.0)
HGB: 12.4 g/dL (ref 12.0–16.0)
LYMPHS PCT: 28.4 %
Lymphocyte #: 1.2 x10 3/mm (ref 1.0–3.6)
MCH: 32.6 pg (ref 26.0–34.0)
MCHC: 34 g/dL (ref 32.0–36.0)
MCV: 96 fL (ref 80–100)
Monocyte #: 0.4 x10 3/mm (ref 0.2–0.9)
Monocyte %: 8.6 %
NEUTROS PCT: 60.1 %
Neutrophil #: 2.6 x10 3/mm (ref 1.4–6.5)
Platelet: 238 x10 3/mm (ref 150–440)
RBC: 3.8 10*6/uL (ref 3.80–5.20)
RDW: 15.5 % — ABNORMAL HIGH (ref 11.5–14.5)
WBC: 4.4 x10 3/mm (ref 3.6–11.0)

## 2013-12-01 LAB — COMPREHENSIVE METABOLIC PANEL
ANION GAP: 10 (ref 7–16)
AST: 23 U/L (ref 15–37)
Albumin: 3.8 g/dL (ref 3.4–5.0)
Alkaline Phosphatase: 47 U/L
BUN: 17 mg/dL (ref 7–18)
Bilirubin,Total: 0.3 mg/dL (ref 0.2–1.0)
CHLORIDE: 105 mmol/L (ref 98–107)
CREATININE: 0.94 mg/dL (ref 0.60–1.30)
Calcium, Total: 9 mg/dL (ref 8.5–10.1)
Co2: 26 mmol/L (ref 21–32)
EGFR (African American): >60
EGFR (Non-African Amer.): >60
Glucose: 123 mg/dL — ABNORMAL HIGH (ref 65–99)
Osmolality: 284 (ref 275–301)
Potassium: 3.7 mmol/L (ref 3.5–5.1)
SGPT (ALT): 51 U/L
Sodium: 141 mmol/L (ref 136–145)
Total Protein: 7.2 g/dL (ref 6.4–8.2)

## 2013-12-08 LAB — CBC CANCER CENTER
BASOS ABS: 0.1 x10 3/mm (ref 0.0–0.1)
Basophil %: 1.5 %
EOS ABS: 0.1 x10 3/mm (ref 0.0–0.7)
EOS PCT: 1.9 %
HCT: 35.3 % (ref 35.0–47.0)
HGB: 12.4 g/dL (ref 12.0–16.0)
LYMPHS ABS: 1.4 x10 3/mm (ref 1.0–3.6)
Lymphocyte %: 27.5 %
MCH: 33.3 pg (ref 26.0–34.0)
MCHC: 35.1 g/dL (ref 32.0–36.0)
MCV: 95 fL (ref 80–100)
MONOS PCT: 7.9 %
Monocyte #: 0.4 x10 3/mm (ref 0.2–0.9)
NEUTROS PCT: 61.2 %
Neutrophil #: 3.2 x10 3/mm (ref 1.4–6.5)
Platelet: 250 x10 3/mm (ref 150–440)
RBC: 3.72 10*6/uL — ABNORMAL LOW (ref 3.80–5.20)
RDW: 15.3 % — ABNORMAL HIGH (ref 11.5–14.5)
WBC: 5.2 x10 3/mm (ref 3.6–11.0)

## 2013-12-09 ENCOUNTER — Ambulatory Visit: Payer: Self-pay | Admitting: Hematology and Oncology

## 2013-12-11 ENCOUNTER — Other Ambulatory Visit: Payer: BC Managed Care – PPO

## 2013-12-11 ENCOUNTER — Ambulatory Visit (INDEPENDENT_AMBULATORY_CARE_PROVIDER_SITE_OTHER): Payer: BC Managed Care – PPO | Admitting: General Surgery

## 2013-12-11 ENCOUNTER — Encounter: Payer: Self-pay | Admitting: General Surgery

## 2013-12-11 VITALS — BP 118/80 | HR 86 | Resp 16 | Ht 59.0 in | Wt 155.0 lb

## 2013-12-11 DIAGNOSIS — C50912 Malignant neoplasm of unspecified site of left female breast: Secondary | ICD-10-CM

## 2013-12-11 DIAGNOSIS — C50919 Malignant neoplasm of unspecified site of unspecified female breast: Secondary | ICD-10-CM

## 2013-12-11 NOTE — Progress Notes (Signed)
Patient ID: Dawn Rivas, female   DOB: 05/03/1966, 48 y.o.   MRN: [id number]  Chief Complaint  Patient presents with  . Follow-up    Discuss left lumpectomy procedure    HPI Dawn Rivas is a 48 y.o. female who presents for a discussion in regards to a left lumpectomy. The patient is currently on chemotherapy treatments. She has two more treatments left with December 22, 2013 being the last one. She denies any new problems with the breasts at this time.   HPI  Past Medical History  Diagnosis Date  . Cancer 2015    left breast    Past Surgical History  Procedure Laterality Date  . Tubal ligation  1997  . Breast enhancement surgery  7 years ago  . Portacath placement  07/24/13  . Breast biopsy Left 2015    Family History  Problem Relation Age of Onset  . Breast cancer Maternal Aunt   . Cervical cancer Mother   . Lung cancer Maternal Grandfather     Social History History  Substance Use Topics  . Smoking status: Never Smoker   . Smokeless tobacco: Never Used  . Alcohol Use: Yes    Allergies  Allergen Reactions  . Penicillins Hives  . Sulfur Hives    Current Outpatient Prescriptions  Medication Sig Dispense Refill  . ALPRAZolam (XANAX) 0.5 MG tablet Take 0.5 mg by mouth 2 (two) times daily as needed.       Marland Kitchen dexamethasone (DECADRON) 4 MG tablet as needed.      Marland Kitchen LORazepam (ATIVAN) 1 MG tablet as needed.      Marland Kitchen omeprazole (PRILOSEC) 20 MG capsule Take 1 capsule by mouth daily.      . TRANSDERM-SCOP 1 MG/3DAYS Place 1 patch onto the skin every 3 (three) days.       No current facility-administered medications for this visit.    Review of Systems Review of Systems  Constitutional: Negative.   Respiratory: Negative.   Cardiovascular: Negative.     Blood pressure 118/80, pulse 86, resp. rate 16, height _0  (1.499 m), weight 155 lb (70.308 kg).  Physical Exam Physical Exam  Constitutional: She is oriented to person, place, and time. She appears  well-developed and well-nourished.  Neck: Neck supple. No thyromegaly present.  Cardiovascular: Normal rate, regular rhythm and normal heart sounds.   No murmur heard. Pulmonary/Chest: Effort normal and breath sounds normal. Right breast exhibits no inverted nipple, no mass, no nipple discharge, no skin change and no tenderness. Left breast exhibits no inverted nipple, no mass, no nipple discharge, no skin change and no tenderness.  Lymphadenopathy:    She has no cervical adenopathy.    She has no axillary adenopathy.  Neurological: She is alert and oriented to person, place, and time.  Skin: Skin is warm and dry.    Data Reviewed PET/CT dated 07/20/2013 identified the primary tumor in the medial aspect of the left breast, left axillary metastases and a suggestion of pain increased uptake in the supraclavicular area. No other evidence of metastatic spread.  By patient report BRCA testing was negative.  Ultrasound examination of the left breast was completed to determine if the previously identified tumor could be located. The left breast was examined through the 6-9:00 position. No clearly discernible area to correspond with the previously identified tumor at the 8:00 position can be appreciated. No obvious axillary adenopathy. No images.  Assessment    Excellent clinical response to neoadjuvant chemotherapy.  Plan    The patient desires to proceed with breast conservation. As the primary site is no longer evident on clinical or ultrasound exam, needle localization will be necessary. Sentinel node biopsy will be completed. The possibility of axilla dissection was reviewed with the patient and her family.  To minimize the risk of postoperative infection, surgery will be delayed 2 weeks after her last planned Taxol therapy.  The patient is aware that her treating medical oncologist is moving and a new medical oncologist will be assigned. Hopefully, this will only be for routine  followup.  Patient's surgery has been scheduled for 01-05-14 at [facility]     PCP: Margarita Rana Ref. MD: Dr. Ericka Pontiff, Forest Gleason 12/12/2013, 3:08 PM

## 2013-12-11 NOTE — Patient Instructions (Addendum)
The patient is aware to call back for any questions or concerns.  Patient's surgery has been scheduled for 01-05-14 at [facility].

## 2013-12-12 ENCOUNTER — Other Ambulatory Visit: Payer: Self-pay | Admitting: General Surgery

## 2013-12-12 DIAGNOSIS — C50912 Malignant neoplasm of unspecified site of left female breast: Secondary | ICD-10-CM

## 2013-12-15 LAB — CBC CANCER CENTER
BASOS ABS: 0 x10 3/mm (ref 0.0–0.1)
Basophil %: 1 %
EOS PCT: 2 %
Eosinophil #: 0.1 x10 3/mm (ref 0.0–0.7)
HCT: 34.6 % — ABNORMAL LOW (ref 35.0–47.0)
HGB: 11.9 g/dL — ABNORMAL LOW (ref 12.0–16.0)
LYMPHS PCT: 29.3 %
Lymphocyte #: 1.3 x10 3/mm (ref 1.0–3.6)
MCH: 32.8 pg (ref 26.0–34.0)
MCHC: 34.4 g/dL (ref 32.0–36.0)
MCV: 95 fL (ref 80–100)
MONOS PCT: 8 %
Monocyte #: 0.4 x10 3/mm (ref 0.2–0.9)
NEUTROS ABS: 2.6 x10 3/mm (ref 1.4–6.5)
NEUTROS PCT: 59.7 %
Platelet: 234 x10 3/mm (ref 150–440)
RBC: 3.63 10*6/uL — AB (ref 3.80–5.20)
RDW: 15.3 % — AB (ref 11.5–14.5)
WBC: 4.4 x10 3/mm (ref 3.6–11.0)

## 2013-12-15 LAB — COMPREHENSIVE METABOLIC PANEL
AST: 17 U/L (ref 15–37)
Albumin: 3.6 g/dL (ref 3.4–5.0)
Alkaline Phosphatase: 48 U/L
Anion Gap: 10 (ref 7–16)
BUN: 19 mg/dL — AB (ref 7–18)
Bilirubin,Total: 0.3 mg/dL (ref 0.2–1.0)
CALCIUM: 8.8 mg/dL (ref 8.5–10.1)
CHLORIDE: 104 mmol/L (ref 98–107)
CO2: 27 mmol/L (ref 21–32)
Creatinine: 0.95 mg/dL (ref 0.60–1.30)
EGFR (Non-African Amer.): >60
Glucose: 107 mg/dL — ABNORMAL HIGH (ref 65–99)
OSMOLALITY: 284 (ref 275–301)
Potassium: 3.9 mmol/L (ref 3.5–5.1)
SGPT (ALT): 44 U/L
SODIUM: 141 mmol/L (ref 136–145)
Total Protein: 7 g/dL (ref 6.4–8.2)

## 2013-12-22 LAB — CBC CANCER CENTER
BASOS ABS: 0 x10 3/mm (ref 0.0–0.1)
BASOS PCT: 0.8 %
EOS PCT: 1.8 %
Eosinophil #: 0.1 x10 3/mm (ref 0.0–0.7)
HCT: 32.3 % — ABNORMAL LOW (ref 35.0–47.0)
HGB: 11.1 g/dL — ABNORMAL LOW (ref 12.0–16.0)
LYMPHS ABS: 1.2 x10 3/mm (ref 1.0–3.6)
LYMPHS PCT: 30.3 %
MCH: 32.7 pg (ref 26.0–34.0)
MCHC: 34.2 g/dL (ref 32.0–36.0)
MCV: 96 fL (ref 80–100)
MONOS PCT: 6.5 %
Monocyte #: 0.2 x10 3/mm (ref 0.2–0.9)
NEUTROS PCT: 60.6 %
Neutrophil #: 2.3 x10 3/mm (ref 1.4–6.5)
Platelet: 206 x10 3/mm (ref 150–440)
RBC: 3.38 10*6/uL — AB (ref 3.80–5.20)
RDW: 15.5 % — ABNORMAL HIGH (ref 11.5–14.5)
WBC: 3.8 x10 3/mm (ref 3.6–11.0)

## 2013-12-29 LAB — CBC CANCER CENTER
BASOS ABS: 0 x10 3/mm (ref 0.0–0.1)
Basophil %: 0.9 %
EOS ABS: 0.1 x10 3/mm (ref 0.0–0.7)
Eosinophil %: 1.6 %
HCT: 36.9 % (ref 35.0–47.0)
HGB: 12.5 g/dL (ref 12.0–16.0)
LYMPHS ABS: 1.3 x10 3/mm (ref 1.0–3.6)
Lymphocyte %: 28.5 %
MCH: 32.7 pg (ref 26.0–34.0)
MCHC: 33.8 g/dL (ref 32.0–36.0)
MCV: 97 fL (ref 80–100)
MONOS PCT: 7.7 %
Monocyte #: 0.4 x10 3/mm (ref 0.2–0.9)
NEUTROS ABS: 2.8 x10 3/mm (ref 1.4–6.5)
NEUTROS PCT: 61.3 %
PLATELETS: 250 x10 3/mm (ref 150–440)
RBC: 3.81 10*6/uL (ref 3.80–5.20)
RDW: 15.7 % — AB (ref 11.5–14.5)
WBC: 4.6 x10 3/mm (ref 3.6–11.0)

## 2013-12-29 LAB — HEPATIC FUNCTION PANEL A (ARMC)
Albumin: 3.8 g/dL (ref 3.4–5.0)
Alkaline Phosphatase: 43 U/L — ABNORMAL LOW
Bilirubin,Total: 0.4 mg/dL (ref 0.2–1.0)
SGOT(AST): 21 U/L (ref 15–37)
SGPT (ALT): 48 U/L
Total Protein: 7.1 g/dL (ref 6.4–8.2)

## 2013-12-29 LAB — CREATININE, SERUM
Creatinine: 0.9 mg/dL (ref 0.60–1.30)
EGFR (African American): >60

## 2014-01-05 ENCOUNTER — Ambulatory Visit: Payer: Self-pay | Admitting: General Surgery

## 2014-01-05 DIAGNOSIS — C50319 Malignant neoplasm of lower-inner quadrant of unspecified female breast: Secondary | ICD-10-CM

## 2014-01-05 HISTORY — PX: BREAST LUMPECTOMY: SHX2

## 2014-01-08 ENCOUNTER — Encounter: Payer: Self-pay | Admitting: General Surgery

## 2014-01-09 ENCOUNTER — Ambulatory Visit: Payer: Self-pay | Admitting: Hematology and Oncology

## 2014-01-09 ENCOUNTER — Ambulatory Visit (INDEPENDENT_AMBULATORY_CARE_PROVIDER_SITE_OTHER): Payer: Self-pay | Admitting: General Surgery

## 2014-01-09 ENCOUNTER — Encounter: Payer: Self-pay | Admitting: General Surgery

## 2014-01-09 VITALS — BP 142/84 | HR 84 | Resp 14 | Ht 59.0 in | Wt 153.0 lb

## 2014-01-09 DIAGNOSIS — C50912 Malignant neoplasm of unspecified site of left female breast: Secondary | ICD-10-CM

## 2014-01-09 DIAGNOSIS — C50919 Malignant neoplasm of unspecified site of unspecified female breast: Secondary | ICD-10-CM

## 2014-01-09 LAB — PATHOLOGY REPORT

## 2014-01-09 NOTE — Patient Instructions (Signed)
Patient to return in 20 days for follow up. The patient is aware to call back for any questions or concerns.

## 2014-01-09 NOTE — Progress Notes (Signed)
Patient ID: Dawn Rivas, female   DOB: 1965/08/22, 48 y.o.   MRN: [id number]  Chief Complaint  Patient presents with  . Routine Post Op    left breast wide excision    HPI Dawn Rivas is a 48 y.o. female here today for her post op left breast wide excision done on 01/05/14. Patient states she is doing well.   HPI  Past Medical History  Diagnosis Date  . Cancer 2015    left breast    Past Surgical History  Procedure Laterality Date  . Tubal ligation  1997  . Breast enhancement surgery  7 years ago  . Portacath placement  07/24/13  . Breast biopsy Left 2015    Family History  Problem Relation Age of Onset  . Breast cancer Maternal Aunt   . Cervical cancer Mother   . Lung cancer Maternal Grandfather     Social History History  Substance Use Topics  . Smoking status: Never Smoker   . Smokeless tobacco: Never Used  . Alcohol Use: Yes    Allergies  Allergen Reactions  . Penicillins Hives  . Sulfur Hives    Current Outpatient Prescriptions  Medication Sig Dispense Refill  . ALPRAZolam (XANAX) 0.5 MG tablet Take 0.5 mg by mouth 2 (two) times daily as needed.       Marland Kitchen dexamethasone (DECADRON) 4 MG tablet as needed.      Marland Kitchen LORazepam (ATIVAN) 1 MG tablet as needed.      Marland Kitchen omeprazole (PRILOSEC) 20 MG capsule Take 1 capsule by mouth daily.      . TRANSDERM-SCOP 1 MG/3DAYS Place 1 patch onto the skin every 3 (three) days.       No current facility-administered medications for this visit.    Review of Systems Review of Systems  Constitutional: Negative.   Respiratory: Negative.   Cardiovascular: Negative.     Blood pressure 142/84, pulse 84, resp. rate 14, height 4\' 11"  (1.499 m), weight 153 lb (69.4 kg).  Physical Exam Physical Exam  Constitutional: She is oriented to person, place, and time. She appears well-developed and well-nourished.  Pulmonary/Chest:    Left breast incisions healing well.   Neurological: She is alert and oriented to person, place, and  time.  Skin: Skin is warm and dry.    Data Reviewed Pathology showed 2 small foci of residual tumor one measuring 1 mm, the second less than 4 mm in diameter. All margins were clear. Sentinel nodes x2 were negative.  Assessment    Near-complete pathologic response with neoadjuvant chemotherapy.     Plan    The patient is doing well. She'll be a candidate for whole breast radiation based on her previous node-positive disease.  She was concerned when she had nausea and vomiting after discontinuing scopolamine patches that her concerns were not adequately addressed to the cancer center. She reports being told to go to the emergency room should she have persistent symptoms. She thought that this was less than a reducible recommendation when she had been under their care.  We reviewed the fact that she's had an excellent response with her chemotherapy, and at this time changing medical oncologist would probably be more trouble that it was worth. She'll consider this option notified the office if she needs to change.  Arrangements are in place for assessment by radiation oncology on September 25.     PCP: Margarita Rana. Leia Alf, M.D.    Robert Bellow 01/10/2014, 8:24 PM

## 2014-01-10 ENCOUNTER — Encounter: Payer: Self-pay | Admitting: General Surgery

## 2014-01-31 ENCOUNTER — Encounter: Payer: Self-pay | Admitting: General Surgery

## 2014-01-31 ENCOUNTER — Ambulatory Visit (INDEPENDENT_AMBULATORY_CARE_PROVIDER_SITE_OTHER): Payer: Self-pay | Admitting: General Surgery

## 2014-01-31 VITALS — BP 130/80 | HR 76 | Resp 12 | Ht 59.0 in | Wt 147.0 lb

## 2014-01-31 DIAGNOSIS — C50919 Malignant neoplasm of unspecified site of unspecified female breast: Secondary | ICD-10-CM

## 2014-01-31 DIAGNOSIS — C50912 Malignant neoplasm of unspecified site of left female breast: Secondary | ICD-10-CM

## 2014-01-31 NOTE — Progress Notes (Signed)
Patient ID: Dawn Rivas, female   DOB: 1965-11-05, 48 y.o.   MRN: [id number]  Chief Complaint  Patient presents with  . Routine Post Op    left breast wide excision    HPI Dawn Rivas is a 48 y.o. female.here today for her post op left breast wide excision done on 01/05/14. Patient states she is doing well.                 HPI  Past Medical History  Diagnosis Date  . Cancer 2015    Left breast, 2 microscopic foci residual tumor: 3.5 mm and one 0.0 mm respectively. Sentinel nodes negative x3. Closest margin 4 mm. Definitive affective neoadjuvant chemotherapy identified both of the breast sentinel lymph node. YyPT1a, N0.  ER 5%, PR 5%, HER-2/neu is not amplified.    Past Surgical History  Procedure Laterality Date  . Tubal ligation  1997  . Breast enhancement surgery  7 years ago  . Portacath placement  07/24/13  . Breast biopsy Left 2015  . Breast lumpectomy Left 01/05/2014    Wide excision, mastoplasty, sentinel node biopsy.    Family History  Problem Relation Age of Onset  . Breast cancer Maternal Aunt   . Cervical cancer Mother   . Lung cancer Maternal Grandfather     Social History History  Substance Use Topics  . Smoking status: Never Smoker   . Smokeless tobacco: Never Used  . Alcohol Use: Yes    Allergies  Allergen Reactions  . Penicillins Hives  . Sulfur Hives    Current Outpatient Prescriptions  Medication Sig Dispense Refill  . ALPRAZolam (XANAX) 0.5 MG tablet Take 0.5 mg by mouth 2 (two) times daily as needed.       Marland Kitchen dexamethasone (DECADRON) 4 MG tablet as needed.      Marland Kitchen LORazepam (ATIVAN) 1 MG tablet as needed.      Marland Kitchen omeprazole (PRILOSEC) 20 MG capsule Take 1 capsule by mouth daily.      . TRANSDERM-SCOP 1 MG/3DAYS Place 1 patch onto the skin every 3 (three) days.       No current facility-administered medications for this visit.    Review of Systems Review of Systems  Constitutional: Negative.   Respiratory: Negative.   Cardiovascular:  Negative.     Blood pressure 130/80, pulse 76, resp. rate 12, height '4\' 11"'  (1.499 m), weight 147 lb (66.679 kg).  Physical Exam Physical Exam  Constitutional: She is oriented to person, place, and time. She appears well-developed and well-nourished.  Pulmonary/Chest: Left breast exhibits no inverted nipple, no mass, no nipple discharge and no skin change.    Left breast incision looks clean and healing well  Neurological: She is alert and oriented to person, place, and time.  Skin: Skin is warm and dry.    Data Reviewed Pathology showed near-complete pathologic response.  Assessment    Doing well status post wide excision and sentinel node biopsy and mastoplasty.    Plan    The patient is in process of having her body mold completed for whole breast radiation. We'll plan for a follow up examination here in 2 months.     PCP: Etheleen Mayhew 02/01/2014, 11:51 AM

## 2014-01-31 NOTE — Patient Instructions (Addendum)
Patient to return in two month.

## 2014-02-01 ENCOUNTER — Encounter: Payer: Self-pay | Admitting: General Surgery

## 2014-02-07 ENCOUNTER — Encounter: Payer: Self-pay | Admitting: General Surgery

## 2014-02-08 ENCOUNTER — Ambulatory Visit: Payer: Self-pay | Admitting: Hematology and Oncology

## 2014-02-19 LAB — CBC CANCER CENTER
BASOS ABS: 0 x10 3/mm (ref 0.0–0.1)
Basophil %: 0.7 %
Eosinophil #: 0.1 x10 3/mm (ref 0.0–0.7)
Eosinophil %: 3.9 %
HCT: 40.8 % (ref 35.0–47.0)
HGB: 13.9 g/dL (ref 12.0–16.0)
Lymphocyte #: 1.1 x10 3/mm (ref 1.0–3.6)
Lymphocyte %: 30.5 %
MCH: 31.9 pg (ref 26.0–34.0)
MCHC: 34 g/dL (ref 32.0–36.0)
MCV: 94 fL (ref 80–100)
MONOS PCT: 9.6 %
Monocyte #: 0.4 x10 3/mm (ref 0.2–0.9)
Neutrophil #: 2 x10 3/mm (ref 1.4–6.5)
Neutrophil %: 55.3 %
Platelet: 205 x10 3/mm (ref 150–440)
RBC: 4.34 10*6/uL (ref 3.80–5.20)
RDW: 13 % (ref 11.5–14.5)
WBC: 3.7 x10 3/mm (ref 3.6–11.0)

## 2014-02-26 LAB — CBC CANCER CENTER
BASOS ABS: 0 x10 3/mm (ref 0.0–0.1)
Basophil %: 1 %
EOS ABS: 0.1 x10 3/mm (ref 0.0–0.7)
Eosinophil %: 3.2 %
HCT: 41.1 % (ref 35.0–47.0)
HGB: 13.9 g/dL (ref 12.0–16.0)
LYMPHS PCT: 26.6 %
Lymphocyte #: 0.9 x10 3/mm — ABNORMAL LOW (ref 1.0–3.6)
MCH: 31.7 pg (ref 26.0–34.0)
MCHC: 33.9 g/dL (ref 32.0–36.0)
MCV: 93 fL (ref 80–100)
MONO ABS: 0.3 x10 3/mm (ref 0.2–0.9)
Monocyte %: 9.4 %
Neutrophil #: 2.1 x10 3/mm (ref 1.4–6.5)
Neutrophil %: 59.8 %
PLATELETS: 176 x10 3/mm (ref 150–440)
RBC: 4.4 10*6/uL (ref 3.80–5.20)
RDW: 12.8 % (ref 11.5–14.5)
WBC: 3.4 x10 3/mm — ABNORMAL LOW (ref 3.6–11.0)

## 2014-03-05 LAB — CBC CANCER CENTER
BASOS ABS: 0 x10 3/mm (ref 0.0–0.1)
Basophil %: 0.8 %
EOS ABS: 0.1 x10 3/mm (ref 0.0–0.7)
EOS PCT: 2.9 %
HCT: 42.5 % (ref 35.0–47.0)
HGB: 14.5 g/dL (ref 12.0–16.0)
LYMPHS ABS: 1 x10 3/mm (ref 1.0–3.6)
LYMPHS PCT: 25.7 %
MCH: 31.6 pg (ref 26.0–34.0)
MCHC: 34 g/dL (ref 32.0–36.0)
MCV: 93 fL (ref 80–100)
MONO ABS: 0.4 x10 3/mm (ref 0.2–0.9)
Monocyte %: 10.6 %
Neutrophil #: 2.3 x10 3/mm (ref 1.4–6.5)
Neutrophil %: 60 %
PLATELETS: 188 x10 3/mm (ref 150–440)
RBC: 4.57 10*6/uL (ref 3.80–5.20)
RDW: 12.7 % (ref 11.5–14.5)
WBC: 3.8 x10 3/mm (ref 3.6–11.0)

## 2014-03-11 ENCOUNTER — Ambulatory Visit: Payer: Self-pay | Admitting: Hematology and Oncology

## 2014-03-11 ENCOUNTER — Ambulatory Visit: Payer: Self-pay | Admitting: Oncology

## 2014-03-12 ENCOUNTER — Encounter: Payer: Self-pay | Admitting: General Surgery

## 2014-03-12 LAB — CBC CANCER CENTER
BASOS ABS: 0 x10 3/mm (ref 0.0–0.1)
BASOS PCT: 0.7 %
Eosinophil #: 0.1 x10 3/mm (ref 0.0–0.7)
Eosinophil %: 3.5 %
HCT: 41.9 % (ref 35.0–47.0)
HGB: 14.2 g/dL (ref 12.0–16.0)
LYMPHS ABS: 0.9 x10 3/mm — AB (ref 1.0–3.6)
LYMPHS PCT: 24 %
MCH: 31.5 pg (ref 26.0–34.0)
MCHC: 34 g/dL (ref 32.0–36.0)
MCV: 93 fL (ref 80–100)
MONO ABS: 0.5 x10 3/mm (ref 0.2–0.9)
Monocyte %: 12.3 %
Neutrophil #: 2.2 x10 3/mm (ref 1.4–6.5)
Neutrophil %: 59.5 %
PLATELETS: 181 x10 3/mm (ref 150–440)
RBC: 4.52 10*6/uL (ref 3.80–5.20)
RDW: 12.4 % (ref 11.5–14.5)
WBC: 3.8 x10 3/mm (ref 3.6–11.0)

## 2014-03-19 LAB — CBC CANCER CENTER
BASOS PCT: 0.6 %
Basophil #: 0 x10 3/mm (ref 0.0–0.1)
EOS ABS: 0.1 x10 3/mm (ref 0.0–0.7)
EOS PCT: 3.9 %
HCT: 41.2 % (ref 35.0–47.0)
HGB: 14.2 g/dL (ref 12.0–16.0)
Lymphocyte #: 0.8 x10 3/mm — ABNORMAL LOW (ref 1.0–3.6)
Lymphocyte %: 22.6 %
MCH: 31.8 pg (ref 26.0–34.0)
MCHC: 34.4 g/dL (ref 32.0–36.0)
MCV: 92 fL (ref 80–100)
Monocyte #: 0.4 x10 3/mm (ref 0.2–0.9)
Monocyte %: 11.3 %
NEUTROS ABS: 2.2 x10 3/mm (ref 1.4–6.5)
NEUTROS PCT: 61.6 %
Platelet: 179 x10 3/mm (ref 150–440)
RBC: 4.46 10*6/uL (ref 3.80–5.20)
RDW: 12.5 % (ref 11.5–14.5)
WBC: 3.6 x10 3/mm (ref 3.6–11.0)

## 2014-03-26 LAB — CBC CANCER CENTER
Basophil #: 0 x10 3/mm (ref 0.0–0.1)
Basophil %: 0.8 %
EOS ABS: 0.1 x10 3/mm (ref 0.0–0.7)
EOS PCT: 4.1 %
HCT: 40.7 % (ref 35.0–47.0)
HGB: 14 g/dL (ref 12.0–16.0)
Lymphocyte #: 0.7 x10 3/mm — ABNORMAL LOW (ref 1.0–3.6)
Lymphocyte %: 21 %
MCH: 31.6 pg (ref 26.0–34.0)
MCHC: 34.4 g/dL (ref 32.0–36.0)
MCV: 92 fL (ref 80–100)
Monocyte #: 0.3 x10 3/mm (ref 0.2–0.9)
Monocyte %: 9.4 %
NEUTROS PCT: 64.7 %
Neutrophil #: 2.1 x10 3/mm (ref 1.4–6.5)
PLATELETS: 173 x10 3/mm (ref 150–440)
RBC: 4.42 10*6/uL (ref 3.80–5.20)
RDW: 12.7 % (ref 11.5–14.5)
WBC: 3.3 x10 3/mm — ABNORMAL LOW (ref 3.6–11.0)

## 2014-04-03 ENCOUNTER — Ambulatory Visit: Payer: BC Managed Care – PPO | Admitting: General Surgery

## 2014-04-03 LAB — CBC CANCER CENTER
BASOS PCT: 0.8 %
Basophil #: 0 x10 3/mm (ref 0.0–0.1)
EOS ABS: 0.1 x10 3/mm (ref 0.0–0.7)
EOS PCT: 3.1 %
HCT: 41.5 % (ref 35.0–47.0)
HGB: 14.2 g/dL (ref 12.0–16.0)
Lymphocyte #: 0.8 x10 3/mm — ABNORMAL LOW (ref 1.0–3.6)
Lymphocyte %: 27.2 %
MCH: 31.2 pg (ref 26.0–34.0)
MCHC: 34.1 g/dL (ref 32.0–36.0)
MCV: 92 fL (ref 80–100)
Monocyte #: 0.3 x10 3/mm (ref 0.2–0.9)
Monocyte %: 11.1 %
NEUTROS ABS: 1.7 x10 3/mm (ref 1.4–6.5)
NEUTROS PCT: 57.8 %
PLATELETS: 182 x10 3/mm (ref 150–440)
RBC: 4.53 10*6/uL (ref 3.80–5.20)
RDW: 12.7 % (ref 11.5–14.5)
WBC: 2.9 x10 3/mm — ABNORMAL LOW (ref 3.6–11.0)

## 2014-04-03 LAB — CREATININE, SERUM
CREATININE: 0.91 mg/dL (ref 0.60–1.30)
EGFR (Non-African Amer.): >60

## 2014-04-03 LAB — HEPATIC FUNCTION PANEL A (ARMC)
AST: 22 U/L (ref 15–37)
Albumin: 3.9 g/dL (ref 3.4–5.0)
Alkaline Phosphatase: 52 U/L
BILIRUBIN DIRECT: 0.1 mg/dL (ref 0.0–0.2)
Bilirubin,Total: 0.5 mg/dL (ref 0.2–1.0)
SGPT (ALT): 52 U/L
Total Protein: 7.3 g/dL (ref 6.4–8.2)

## 2014-04-04 LAB — CANCER ANTIGEN 27.29: CA 27.29: 19.6 U/mL (ref 0.0–38.6)

## 2014-04-10 ENCOUNTER — Ambulatory Visit: Payer: Self-pay | Admitting: Oncology

## 2014-04-10 ENCOUNTER — Encounter: Payer: Self-pay | Admitting: General Surgery

## 2014-04-10 ENCOUNTER — Ambulatory Visit (INDEPENDENT_AMBULATORY_CARE_PROVIDER_SITE_OTHER): Payer: BC Managed Care – PPO | Admitting: General Surgery

## 2014-04-10 VITALS — BP 118/70 | HR 74 | Resp 12 | Ht 59.0 in | Wt 160.0 lb

## 2014-04-10 DIAGNOSIS — C50912 Malignant neoplasm of unspecified site of left female breast: Secondary | ICD-10-CM

## 2014-04-10 NOTE — Patient Instructions (Signed)
Patient to return to get a port removal.

## 2014-04-10 NOTE — Progress Notes (Signed)
Patient ID: Dawn Rivas, female   DOB: 11-30-1965, 48 y.o.   MRN: [id number]  Chief Complaint  Patient presents with  . Follow-up    breast cancer    HPI Dawn Rivas is a 48 y.o. female here today following up from breast cancer . Patient had a left breast wide excision on 01/05/14. Patient states she is doing well. Patient states she will be starting letrozole on Monday.She states she finished radiation on  04/03/14.  The patient reports that she was told by her medical oncologist that her port could be removed. HPI  Past Medical History  Diagnosis Date  . Cancer 2015    Left breast, 2 microscopic foci residual tumor: 3.5 mm and one 0.0 mm respectively. Sentinel nodes negative x3. Closest margin 4 mm. Definitive affective neoadjuvant chemotherapy identified both of the breast sentinel lymph node. YyPT1a, N0.  ER 5%, PR 5%, HER-2/neu is not amplified.    Past Surgical History  Procedure Laterality Date  . Tubal ligation  1997  . Breast enhancement surgery  7 years ago  . Portacath placement  07/24/13  . Breast biopsy Left 2015  . Breast lumpectomy Left 01/05/2014    Wide excision, mastoplasty, sentinel node biopsy.    Family History  Problem Relation Age of Onset  . Breast cancer Maternal Aunt   . Cervical cancer Mother   . Lung cancer Maternal Grandfather     Social History History  Substance Use Topics  . Smoking status: Never Smoker   . Smokeless tobacco: Never Used  . Alcohol Use: Yes    Allergies  Allergen Reactions  . Penicillins Hives  . Sulfur Hives    Current Outpatient Prescriptions  Medication Sig Dispense Refill  . ALPRAZolam (XANAX) 0.5 MG tablet Take 0.5 mg by mouth 2 (two) times daily as needed.     Marland Kitchen dexamethasone (DECADRON) 4 MG tablet as needed.    Marland Kitchen LORazepam (ATIVAN) 1 MG tablet as needed.    Marland Kitchen omeprazole (PRILOSEC) 20 MG capsule Take 1 capsule by mouth daily.    . TRANSDERM-SCOP 1 MG/3DAYS Place 1 patch onto the skin every 3 (three) days.      No current facility-administered medications for this visit.    Review of Systems Review of Systems  Constitutional: Negative.   Respiratory: Negative.   Cardiovascular: Negative.     Blood pressure 118/70, pulse 74, resp. rate 12, height '4\' 11"'  (1.499 m), weight 160 lb (72.576 kg).  Physical Exam Physical Exam  Constitutional: She is oriented to person, place, and time. She appears well-developed and well-nourished.  Eyes: Conjunctivae are normal. No scleral icterus.  Neck: Neck supple.  Cardiovascular: Normal rate, regular rhythm and normal heart sounds.   Pulmonary/Chest: Effort normal and breath sounds normal. Right breast exhibits no inverted nipple, no mass, no nipple discharge, no skin change and no tenderness. Left breast exhibits no inverted nipple, no mass, no nipple discharge, no skin change and no tenderness.    Left breast area of radiation changes in the lower inner quadrant.   Lymphadenopathy:    She has no cervical adenopathy.  Neurological: She is alert and oriented to person, place, and time.  Skin: Skin is warm and dry.     5 by 7 cm area on left shoulder due to radiation changes.     Data Reviewed Medical oncology note of 04/08/2014.  Assessment    The patient has done well status post chemotherapy, radiation and surgery for treatment of her  breast cancer.    Plan  Patient to return to get her port removal .  We'll confirm with medical oncology that it is appropriate to remove her port. She is anxious about having the procedure completed under local anesthesia. We reviewed the possibility of making use of Valium in the preoperative period and this was acceptable to the patient. Informed consent was obtained today. Make use of Valium, 10 mg prior to the procedure. This will be scheduled convenient date.    PCP:  Etheleen Mayhew 04/11/2014, 9:46 PM

## 2014-04-30 ENCOUNTER — Ambulatory Visit (INDEPENDENT_AMBULATORY_CARE_PROVIDER_SITE_OTHER): Payer: BC Managed Care – PPO | Admitting: General Surgery

## 2014-04-30 ENCOUNTER — Encounter: Payer: Self-pay | Admitting: *Deleted

## 2014-04-30 ENCOUNTER — Encounter: Payer: Self-pay | Admitting: General Surgery

## 2014-04-30 VITALS — BP 122/70 | HR 80 | Resp 12 | Ht 59.0 in | Wt 159.0 lb

## 2014-04-30 DIAGNOSIS — C50912 Malignant neoplasm of unspecified site of left female breast: Secondary | ICD-10-CM

## 2014-04-30 NOTE — Progress Notes (Signed)
Patient ID: Dawn Rivas, female   DOB: 1966/04/08, 48 y.o.   MRN: [id number]  Chief Complaint  Patient presents with  . Procedure    port removal    HPI Dawn Rivas is a 48 y.o. female here today for a port removal. She is accompanied by her husband who was present through the procedure. HPI  Past Medical History  Diagnosis Date  . Cancer 2015    Left breast, 2 microscopic foci residual tumor: 3.5 mm and one 0.0 mm respectively. Sentinel nodes negative x3. Closest margin 4 mm. Definitive affective neoadjuvant chemotherapy identified both of the breast sentinel lymph node. YyPT1a, N0.  ER 5%, PR 5%, HER-2/neu is not amplified.    Past Surgical History  Procedure Laterality Date  . Tubal ligation  1997  . Breast enhancement surgery  7 years ago  . Portacath placement  07/24/13  . Breast biopsy Left 2015  . Breast lumpectomy Left 01/05/2014    Wide excision, mastoplasty, sentinel node biopsy.    Family History  Problem Relation Age of Onset  . Breast cancer Maternal Aunt   . Cervical cancer Mother   . Lung cancer Maternal Grandfather     Social History History  Substance Use Topics  . Smoking status: Never Smoker   . Smokeless tobacco: Never Used  . Alcohol Use: Yes    Allergies  Allergen Reactions  . Penicillins Hives  . Sulfur Hives    Current Outpatient Prescriptions  Medication Sig Dispense Refill  . ALPRAZolam (XANAX) 0.5 MG tablet Take 0.5 mg by mouth 2 (two) times daily as needed.     Marland Kitchen dexamethasone (DECADRON) 4 MG tablet as needed.    Marland Kitchen letrozole (FEMARA) 2.5 MG tablet     . LORazepam (ATIVAN) 1 MG tablet as needed.    Marland Kitchen omeprazole (PRILOSEC) 20 MG capsule Take 1 capsule by mouth daily.    . TRANSDERM-SCOP 1 MG/3DAYS Place 1 patch onto the skin every 3 (three) days.     No current facility-administered medications for this visit.    Review of Systems Review of Systems  Constitutional: Negative.   Respiratory: Negative.   Cardiovascular: Negative.      Blood pressure 122/70, pulse 80, resp. rate 12, height _0  (1.499 m), weight 159 lb (72.122 kg).  Physical Exam Physical Exam Next examination of the right chest wall shows no evidence of infection or induration at the power port site.  Data Reviewed Permission for port removal has been received from the treating oncologist.  Assessment    Completion of neo-adjuvant chemotherapy, no further indication for central venous access.    Plan    The area was prepped with alcohol and 10 mL of 0.5% Xylocaine with 0.25% Marcaine with 1-200,000 units of epinephrine was utilized for local anesthesia well tolerated.  The area was prepped with ChloraPrep and draped. The original incision was opened sharply. Hemostasis was with 3-0 Vicryl suture ligature. The port was exposed and the previously placed Prolene sutures were removed. The port was removed with the catheter tip intact. No bleeding was noted. The wound was closed in layers with a running 3-0 Vicryl to the adipose layer and a running 3-0 Vicryl subcutaneous suture for the skin. Benzoin, Steri-Strips followed by Telfa and Tegaderm dressing was applied. Ice pack provided.  Postprocedure wound instructions reviewed. The patient will return in one week for nursing wound check.  Patient to return in Feb 2016 bilateral diagnotic mammogram. PCP:  Margarita Rana, Leia Alf,  M.D.       Robert Bellow 04/30/2014, 8:47 PM

## 2014-04-30 NOTE — Patient Instructions (Signed)
Patient to return in Feb 2016 bilateral diagnotic mammogram. 1 week nurse

## 2014-05-07 ENCOUNTER — Ambulatory Visit (INDEPENDENT_AMBULATORY_CARE_PROVIDER_SITE_OTHER): Payer: Self-pay | Admitting: *Deleted

## 2014-05-07 DIAGNOSIS — C50912 Malignant neoplasm of unspecified site of left female breast: Secondary | ICD-10-CM

## 2014-05-07 NOTE — Progress Notes (Signed)
Patient came in today for a wound check.  The wound is clean, with no signs of infection noted. .Follow up as scheduled.  

## 2014-05-17 ENCOUNTER — Ambulatory Visit: Payer: Self-pay | Admitting: Oncology

## 2014-07-04 ENCOUNTER — Ambulatory Visit: Payer: Self-pay | Admitting: Oncology

## 2014-07-06 ENCOUNTER — Ambulatory Visit: Payer: Self-pay | Admitting: General Surgery

## 2014-07-09 ENCOUNTER — Encounter: Payer: Self-pay | Admitting: General Surgery

## 2014-07-09 ENCOUNTER — Ambulatory Visit (INDEPENDENT_AMBULATORY_CARE_PROVIDER_SITE_OTHER): Payer: BLUE CROSS/BLUE SHIELD | Admitting: General Surgery

## 2014-07-09 VITALS — BP 118/72 | HR 76 | Resp 14 | Ht 59.0 in | Wt 158.0 lb

## 2014-07-09 DIAGNOSIS — Z1211 Encounter for screening for malignant neoplasm of colon: Secondary | ICD-10-CM | POA: Diagnosis not present

## 2014-07-09 DIAGNOSIS — C50912 Malignant neoplasm of unspecified site of left female breast: Secondary | ICD-10-CM

## 2014-07-09 MED ORDER — POLYETHYLENE GLYCOL 3350 17 GM/SCOOP PO POWD: ORAL | Status: DC

## 2014-07-09 NOTE — Patient Instructions (Addendum)
Patient to return in 6 months for follow up.  Patient also to be scheduled for a colonoscopy. The patient is aware to call back for any questions or concerns.   Colonoscopy A colonoscopy is an exam to look at the entire large intestine (colon). This exam can help find problems such as tumors, polyps, inflammation, and areas of bleeding. The exam takes about 1 hour.  LET [facility] CARE PROVIDER KNOW ABOUT:   Any allergies you have.  All medicines you are taking, including vitamins, herbs, eye drops, creams, and over-the-counter medicines.  Previous problems you or members of your family have had with the use of anesthetics.  Any blood disorders you have.  Previous surgeries you have had.  Medical conditions you have. RISKS AND COMPLICATIONS  Generally, this is a safe procedure. However, as with any procedure, complications can occur. Possible complications include:  Bleeding.  Tearing or rupture of the colon wall.  Reaction to medicines given during the exam.  Infection (rare). BEFORE THE PROCEDURE   Ask your health care provider about changing or stopping your regular medicines.  You may be prescribed an oral bowel prep. This involves drinking a large amount of medicated liquid, starting the day before your procedure. The liquid will cause you to have multiple loose stools until your stool is almost clear or light green. This cleans out your colon in preparation for the procedure.  Do not eat or drink anything else once you have started the bowel prep, unless your health care provider tells you it is safe to do so.  Arrange for someone to drive you home after the procedure. PROCEDURE   You will be given medicine to help you relax (sedative).  You will lie on your side with your knees bent.  A long, flexible tube with a light and camera on the end (colonoscope) will be inserted through the rectum and into the colon. The camera sends video back to a computer screen as it  moves through the colon. The colonoscope also releases carbon dioxide gas to inflate the colon. This helps your health care provider see the area better.  During the exam, your health care provider may take a small tissue sample (biopsy) to be examined under a microscope if any abnormalities are found.  The exam is finished when the entire colon has been viewed. AFTER THE PROCEDURE   Do not drive for 24 hours after the exam.  You may have a small amount of blood in your stool.  You may pass moderate amounts of gas and have mild abdominal cramping or bloating. This is caused by the gas used to inflate your colon during the exam.  Ask when your test results will be ready and how you will get your results. Make sure you get your test results. Document Released: 04/24/2000 Document Revised: 02/15/2013 Document Reviewed: 01/02/2013 [facility] Patient Information 2015 [place], [place]. This information is not intended to replace advice given to you by your health care provider. Make sure you discuss any questions you have with your health care provider.  Patient has been scheduled for a colonoscopy on 08-01-14 at [facility].

## 2014-07-09 NOTE — Progress Notes (Signed)
Patient ID: Dawn Rivas, female   DOB: 02-Jun-1965, 49 y.o.   MRN: [id number]  Chief Complaint  Patient presents with  . Follow-up    mammogram     HPI Dawn Rivas is a 49 y.o. female who presents for a breast evaluation. The most recent mammogram was done on 07/06/14. Patient does perform regular self breast checks and gets regular mammograms done. No new complaints with the breasts at this time.    HPI  Past Medical History  Diagnosis Date  . Cancer 2015    Left breast, 2 microscopic foci residual tumor: 3.5 mm and one 0.0 mm respectively. Sentinel nodes negative x3. Closest margin 4 mm. Definitive affective neoadjuvant chemotherapy identified both of the breast sentinel lymph node. YyPT1a, N0.  ER 5%, PR 5%, HER-2/neu is not amplified.    Past Surgical History  Procedure Laterality Date  . Tubal ligation  1997  . Breast enhancement surgery  7 years ago  . Portacath placement  07/24/13  . Breast biopsy Left 2015  . Breast lumpectomy Left 01/05/2014    Wide excision, mastoplasty, sentinel node biopsy.    Family History  Problem Relation Age of Onset  . Breast cancer Maternal Aunt   . Cervical cancer Mother   . Lung cancer Maternal Grandfather     Social History History  Substance Use Topics  . Smoking status: Never Smoker   . Smokeless tobacco: Never Used  . Alcohol Use: Yes    Allergies  Allergen Reactions  . Penicillins Hives  . Sulfur Hives    Current Outpatient Prescriptions  Medication Sig Dispense Refill  . letrozole (FEMARA) 2.5 MG tablet     . omeprazole (PRILOSEC) 20 MG capsule Take 1 capsule by mouth daily.    . RESTASIS 0.05 % ophthalmic emulsion Place 1 vial into both eyes 2 (two) times daily.    . polyethylene glycol powder (GLYCOLAX/MIRALAX) powder 255 grams one bottle for colonoscopy prep 255 g 0   No current facility-administered medications for this visit.    Review of Systems Review of Systems  Constitutional: Negative.   Respiratory:  Negative.   Cardiovascular: Negative.     Blood pressure 118/72, pulse 76, resp. rate 14, height _0  (1.499 m), weight 158 lb (71.668 kg).  Physical Exam Physical Exam  Constitutional: She is oriented to person, place, and time. She appears well-developed and well-nourished.  Neck: Neck supple. No thyromegaly present.  Cardiovascular: Normal rate, regular rhythm and normal heart sounds.   No murmur heard. Pulmonary/Chest: Effort normal and breath sounds normal. Right breast exhibits no inverted nipple, no mass, no nipple discharge, no skin change and no tenderness. Left breast exhibits no inverted nipple, no mass, no nipple discharge, no skin change and no tenderness.  Lymphadenopathy:    She has no cervical adenopathy.    She has no axillary adenopathy.  Little thickening at the lower inner quadrant at wide excision site.   Neurological: She is alert and oriented to person, place, and time.  Skin: Skin is warm and dry.    Data Reviewed Left breast mammogram dated 07/06/2014 was reviewed. Formal report not available. Post surgical changes noted. BI-RADS-2.  Assessment    Doing well status post partial mastectomy and radiation of the left breast.  Candidate for screening colonoscopy.    Plan     The patient reports that her mother told her that she was of age to have a colon screening. Considering her previous breast cancer she is  an appropriate candidate for screening colonoscopy. The pros and cons the procedure including the risks associated with bleeding and perforation were reviewed. Patient has been scheduled for a colonoscopy on 08-01-14 at [facility].     PCP:  Etheleen Mayhew 07/10/2014, 8:23 PM

## 2014-07-10 ENCOUNTER — Other Ambulatory Visit: Payer: Self-pay | Admitting: General Surgery

## 2014-07-10 ENCOUNTER — Ambulatory Visit
Admit: 2014-07-10 | Disposition: A | Discharge status: Home / Self Care | Payer: Self-pay | Attending: Oncology | Admitting: Oncology

## 2014-07-10 DIAGNOSIS — Z1211 Encounter for screening for malignant neoplasm of colon: Secondary | ICD-10-CM

## 2014-08-01 ENCOUNTER — Ambulatory Visit: Payer: Self-pay | Admitting: General Surgery

## 2014-08-01 DIAGNOSIS — Z1211 Encounter for screening for malignant neoplasm of colon: Secondary | ICD-10-CM | POA: Diagnosis not present

## 2014-08-01 HISTORY — PX: COLONOSCOPY: SHX174

## 2014-08-01 LAB — HEPATIC FUNCTION PANEL
ALK PHOS: 60 U/L (ref 25–125)
ALT: 46 U/L — AB (ref 7–35)
AST: 32 U/L (ref 13–35)
Bilirubin, Total: 0.7 mg/dL

## 2014-08-01 LAB — LIPID PANEL
CHOLESTEROL: 233 mg/dL — AB (ref 0–200)
HDL: 54 mg/dL (ref 35–70)
LDL CALC: 156 mg/dL
LDL/HDL RATIO: 2.9
TRIGLYCERIDES: 116 mg/dL (ref 40–160)

## 2014-08-01 LAB — CBC AND DIFFERENTIAL
HEMATOCRIT: 44 % (ref 36–46)
HEMOGLOBIN: 15.4 g/dL (ref 12.0–16.0)
NEUTROS ABS: 2 /uL
PLATELETS: 248 10*3/uL (ref 150–399)
WBC: 3.7 10*3/mL

## 2014-08-01 LAB — BASIC METABOLIC PANEL
BUN: 12 mg/dL (ref 4–21)
Creatinine: 0.9 mg/dL (ref 0.5–1.1)
Glucose: 104 mg/dL
Potassium: 5 mmol/L (ref 3.4–5.3)
Sodium: 142 mmol/L (ref 137–147)

## 2014-08-01 LAB — TSH: TSH: 2.58 u[IU]/mL (ref 0.41–5.90)

## 2014-08-02 ENCOUNTER — Encounter: Payer: Self-pay | Admitting: General Surgery

## 2014-09-01 NOTE — Consult Note (Signed)
Reason for Visit: This 49 year old Female patient presents to the clinic for initial evaluation of  breast cancer .   Referred by Dr. Bary Castilla.  Diagnosis:  Chief Complaint/Diagnosis   49 year old female with biopsy-positive invasive mammary carcinoma 2 cm lesion with positive axillary lymph node status post neoadjuvant chemotherapy now with residual T1, N0, M0 disease status post wide local excision and sentinel node biopsy tumor is borderline ER PR positive HER-2/neu is not overexpressed.initial stage was T2, N1, M0 stage IIb invasive mammary carcinoma status post neoadjuvant chemotherapy reduced to stage I disease  Pathology Report pathology report reviewed   Imaging Report PET CT scan and mammogram is reviewed   Referral Report clinical notes reviewed   Planned Treatment Regimen adjuvant whole breast and peripheral radiation   HPI   patient is a 49 year old female who presents with self discovered left breast mass status post mammography showing suspicious a 2 cmabnormality approximately 7 cm from the nipple and from an ultrasound measuring 1.2 x 2.2 x 2.3 cm. There is also an abnormal axillary lymph node measuring 2.7 cm in greatest dimension. Patient has saline implants. Biopsy was performed of both breasts mass as well as left axillary lymph node both positive for invasive mammary carcinoma with metastatic disease in the left axillary node. Tumor was borderline ER/PR positive HER-2/neu not over expressed. PET CT scan demonstrated again hypermetabolic activity in the left breast as well as left axilla and subtle findings in the left supraclavicular fossa.Patient underwent 3 cycles of Cytoxan Adriamycin causingsignificant nausea and upper endoscopy showed significant gastritis. Cycle 4 was omitted. She went on to have weekly Taxolwhich she completed. She then went on to have a wide local excision and sentinel node biopsy with 2 foci of residual disease noted in the left breast one 3.5 mm the  other 1.1 mm both grade 3 margins were clear at 4 mm. 3 sentinel lymph nodes were examined all negative for metastatic disease. Patient is now referred to radiation oncology for consideration of adjuvant treatment. She is doing well at this time. She specifically denies breast tenderness cough or bone pain.  Past Hx:    Breast Cancer:    Hypercholesterolemia:    Herpes Labialis:    hematuria:    portacath: Mar 2015   Tubal Ligation:    Breast Augmentation:   Past, Family and Social History:  Past Medical History positive   Cardiovascular hyperlipidemia   Gastrointestinal GERD; hiatal hernia   Genitourinary history hematuria, herpes labialis   Past Surgical History tubal ligation, bilateral breast augmentation   Family History positive   Family History Comments family history positive for maternal aunt with breast cancer mother with cervical cancer and maternal grandparent with lung cancer   Social History noncontributory   Additional Past Medical and Surgical History accompanied by her father today   Allergies:   Sulfa drugs: Anaphylaxis  Penicillin: Anaphylaxis  Home Meds:  Home Medications: Medication Instructions Status  omeprazole 20 mg oral delayed release capsule 1 cap(s) orally once a day Active   Review of Systems:  General negative   Performance Status (ECOG) 0   Skin negative   Breast see HPI   Ophthalmologic negative   ENMT negative   Respiratory and Thorax negative   Cardiovascular negative   Gastrointestinal negative   Genitourinary negative   Musculoskeletal negative   Neurological negative   Psychiatric negative   Hematology/Lymphatics negative   Endocrine negative   Allergic/Immunologic negative   Review of Systems  denies any weight loss, fatigue, weakness, fever, chills or night sweats. Patient denies any loss of vision, blurred vision. Patient denies any ringing  of the ears or hearing loss. No irregular heartbeat.  Patient denies heart murmur or history of fainting. Patient denies any chest pain or pain radiating to her upper extremities. Patient denies any shortness of breath, difficulty breathing at night, cough or hemoptysis. Patient denies any swelling in the lower legs. Patient denies any nausea vomiting, vomiting of blood, or coffee ground material in the vomitus. Patient denies any stomach pain. Patient states has had normal bowel movements no significant constipation or diarrhea. Patient denies any dysuria, hematuria or significant nocturia. Patient denies any problems walking, swelling in the joints or loss of balance. Patient denies any skin changes, loss of hair or loss of weight. Patient denies any excessive worrying or anxiety or significant depression. Patient denies any problems with insomnia. Patient denies excessive thirst, polyuria, polydipsia. Patient denies any swollen glands, patient denies easy bruising or easy bleeding. Patient denies any recent infections, allergies or URI. Patient "s visual fields have not changed significantly in recent time.   Nursing Notes:  Nursing Vital Signs and Chemo Nursing Nursing Notes: *CC Vital Signs Flowsheet:   21-Sep-15 10:54  Temp Temperature 98  Pulse Pulse 98  Respirations Respirations 18  SBP SBP 132  DBP DBP 87  Pain Scale (0-10)  0  Current Weight (kg) (kg) 70.9  Height (cm) centimeters 149.9  BSA (m2) 1.6   Physical Exam:  General/Skin/HEENT:  General normal   Skin normal   Eyes normal   ENMT normal   Head and Neck normal   Additional PE a well-developed female in NAD. Lungs are clear to A&P cardiac examination shows regular rate and rhythm. She has bilateral breast implants. No dominant mass or nodularity is noted in either breast in 2 positions examined. No axillary or supraclavicular adenopathy is appreciated. Abdomen is benign. Wide local excision is healed well.   Breasts/Resp/CV/GI/GU:  Respiratory and Thorax normal    Cardiovascular normal   Gastrointestinal normal   Genitourinary normal   MS/Neuro/Psych/Lymph:  Musculoskeletal normal   Neurological normal   Lymphatics normal   Other Results:  Radiology Results: LabUnknown:    25-Feb-15 14:05, Digital Diagnostic Mammogram Bilateral  PACS Image     12-Mar-15 09:25, PET/CT Scan Breast CA Stage/Restaging  PACS [place]:    25-Feb-15 14:05, Digital Diagnostic Mammogram Bilateral  Digital Diagnostic Mammogram Bilateral   REASON FOR EXAM:    LT BR NODULE 7 O'CLOCK AND YRLY IMPLANTS  COMMENTS:       PROCEDURE: MAM - MAM DGTL DIAGNOSTIC MAMMO W/CAD  - Jul 05 2013  2:05PM     CLINICAL DATA:  Patient presents for a bilateral diagnostic exam to  evaluate a new palpable abnormality over the left breast.    EXAM:  DIGITAL DIAGNOSTIC  bilateral MAMMOGRAM WITH CAD    ULTRASOUND left foot BREAST    COMPARISON:  Previous exams.  ACR Breast Density Category b: There are scattered areas of  fibroglandular density.    FINDINGS:  Examination demonstrates bilateral retropectoral saline implants  intact and unchanged. There is a mass over the inner lower left  breast corresponding to patient's palpable abnormality. This mass is  ovoid in shape with some borders which are indistinct and some which  are circumscribed.    Mammographic images were processed with CAD.    On physical exam, I palpate a somewhat mobile ovoid  2 cm mass over  the 7 to 8 o'clock position of the left breast approximately 7 cm  from the nipple.  Ultrasound is performed, showing an ovoid hypoechoic somewhat  heterogeneous mass at the 7 to 8 o'clock position of the left breast  7 cm from the nipple corresponding to patient's palpable  abnormality. This has a parallel long axis and has some internal  cystic areas as well as some areas where the borders are more  irregular and angulated. This mass measures 1.2 x 2.2 x 2.3 cm.    Ultrasound of the left axilla  demonstrates an abnormal lower  axillary lymph node completely hypoechoic measuring 1.6 x 2.7 x 2.9  cm.     IMPRESSION:  Suspicious ovoid somewhat heterogeneous hypoechoic mass at the 7 to  8 o'clock position of the left breast 7 cm from the nipple  corresponding to patient's palpable abnormality. Abnormal lower left  axillary lymph node.    RECOMMENDATION:  Recommend ultrasound-guided core needle biopsy of this suspicious  mass and abnormal lymph node.    I have discussed the findings and recommendations with the patient.  Results were also provided in writing at the conclusion of the  visit. If applicable, a reminder letter will be sent to the patient  regarding the next appointment.    BI-RADS CATEGORY  4: Suspicious abnormality - biopsy should be  considered.    Biopsy scheduled for Monday 07/10/2013 at 9 a.m..  Electronically Signed    By: Marin Olp M.D.    On: 07/05/2013 15:17         Verified By: Pearletha Alfred, M.D.,  Nuclear Med:    12-Mar-15 09:25, PET/CT Scan Breast CA Stage/Restaging  PET/CT Scan Breast CA Stage/Restaging   REASON FOR EXAM:    Breast CA Staging Metastatic breast CA  COMMENTS:       PROCEDURE: PET - PET/CT RESTG BREAST CA  - Jul 20 2013  9:25AM     CLINICAL DATA:  Initial treatment strategy for newly diagnosed left  breast cancer with lymph node metastasis.    EXAM:  NUCLEAR MEDICINE PET SKULL BASE TO THIGH    TECHNIQUE:  12.4 mCi F-18 FDG was injected intravenously. Full-ring PET imaging  was performed from the skull base to thigh after the radiotracer. CT  data was obtained and used for attenuationcorrection and anatomic  localization.    FASTING BLOOD GLUCOSE:  Value: 87 mg/dl    COMPARISON:  None.    FINDINGS:  NECK    No hypermetabolic lymph nodes in the neck.    CHEST    1.6 x 2.6 cm soft tissue lesion in the medial left breast (series 3/  image 91), max SUV 20.9, corresponding to biopsy-proven  breast  cancer.    2.4 cm short axis left axillary node (series 3/ image 65), max SUV  12.6, corresponding to known left axillary nodal metastasis.  Additional 8 mm short axis left axillary node(series 3/image 62).    Vague/mild hypermetabolism in the left supraclavicular region, max  SUV 4.3, with associated small lymph nodes measuring up to 5 mm  short axis (series 3/image 53). This appearance is considered  indeterminate.    Bilateral breast augmentation.    No suspicious pulmonary nodules on CT.  ABDOMEN/PELVIS    No abnormal hypermetabolic activity within the liver, pancreas,  adrenal glands, or spleen.    6.8 cm cyst in the medial segment left hepatic lobe (series 3/image  103). Suspected mild hepatic  steatosis. Mildly heterogeneous  appearance of the uterus could reflect uterine fibroids.    No hypermetabolic lymph nodes in the abdomen or pelvis.    SKELETON    No focal hypermetabolic activity to suggest skeletal metastasis.   IMPRESSION:  2.6 cm soft tissue lesion in the medial left breast, corresponding  to known primary breast cancer.    Associated left axillary lymph node metastases.    Vague/mild hypermetabolism in the left supraclavicular region with  small lymph nodes measuring up to 5 mm short axis, indeterminate.      Electronically Signed    By: Julian Hy M.D.    On: 07/20/2013 11:07       Verified By: Julian Hy, M.D.,   Relevent Results:   Relevant Scans and Labs mammograms ultrasound and CT scan are all reviewed   Assessment and Plan: Impression:   initial T2, N1, M0 stage IIB invasive mammary carcinoma status post neoadjuvant chemotherapy down staged to a T1, N0, M0 lesion basically triple negative except for mild ER/PR positivity in 49 year-old female. Plan:   patient's case was presented a viral breast cancer conference. Based on the borderline triple negative nature of her disease initial large axillary lymph node  involvement and questionable subtle hypermetabolic activity in the supraclavicular fossa would treat her whole breast and peripheral lymphatics to 5000 cGy. I would then booster scar another 1400 cGy using electrons. Risks and benefits of treatment including irritation of the breast, possible contraction around her implants, fatigue, alteration blood counts, and slight chance of lymphedema in her left upper extremity all were explained in detail to the patient. She seems to comprehend her treatment plan well. I have set her up for CT simulation later this week. I will leave to medical oncology's decision about whether aromatase inhibitor is indicated in her case.  I would like to take this opportunity for allowing me to participate in the care of your patient..  Fax to Physician:  [facility] Fax: Robert Bellow - [phone].  Electronic Signatures: Rubbie Goostree, Roda Shutters (MD)  (Signed 22-Sep-15 10:55)  Authored: HPI, Diagnosis, Past Hx, PFSH, Allergies, Home Meds, ROS, Nursing Notes, Physical Exam, Other Results, Relevent Results, Encounter Assessment and Plan, Fax to Physician   Last Updated: 22-Sep-15 10:55 by Armstead Peaks (MD)

## 2014-09-01 NOTE — Op Note (Signed)
PATIENT NAME:  Dawn Rivas, BACCHI MR#:  [id number] DATE OF BIRTH:  19-Apr-1966  DATE OF PROCEDURE:  07/24/2013  PREOPERATIVE DIAGNOSIS: Advanced left breast cancer, need for central venous access.   POSTOPERATIVE DIAGNOSIS:  Advanced left breast cancer, need for central venous access.   OPERATIVE PROCEDURE: Right subclavian PowerPort placement.   OPERATING SURGEON: Robert Bellow, MD.  ANESTHESIA: Attended local, 10 mL of 1% plain Xylocaine.   ESTIMATED BLOOD LOSS: Minimal.   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 Central venous access was requested by her treating oncologist.   OPERATIVE NOTE: With the patient comfortably supine on the operating table, the right chest and neck were prepped with ChloraPrep and draped. The patient was placed into Trendelenburg position. Ultrasound examination of the subclavian vein showed it to be modestly small with significant contraction on respiration. Better visualization was obtained with increased Trendelenburg positioning. Under ultrasound guidance, the vein was cannulated on a single stick. The guidewire was advanced. Fluoroscopy was utilized to position the guidewire into the distal SVC. The dilator system was then placed followed by the cannula. This was seen on fluoroscopy to be crossing into the left subclavian. Under fluoroscopy, the catheter was guided into the SVC and positioned at the junction of the SVC and right atrium. It was then tunneled to a pocket on the right anterior chest. The port was anchored to the deep tissue with 2-point fixation with 3-0 Prolene sutures. Care was taken not to violate the underlying breast prosthesis. The catheter easily irrigated and aspirated. It was flushed with 10 mL of saline at the end of the procedure.   The wound was closed with a running 3-0 Vicryl to the adipose layer and a running 4-0  Vicryl subcuticular suture for the skin. Benzoin, Steri-Strips, Telfa, and Tegaderm dressing were then applied.   The patient tolerated the procedure well and was taken to the recovery room in stable condition.    ____________________________ Robert Bellow, MD jwb:by D: 07/24/2013 13:36:03 ET T: 07/24/2013 21:21:21 ET JOB#: [id number]  cc: Robert Bellow, MD, <Dictator> Rae Halsted. Kallie Edward, MD Suetta Hoffmeister Amedeo Kinsman MD ELECTRONICALLY SIGNED 07/25/2013 9:23

## 2014-09-01 NOTE — Op Note (Signed)
PATIENT NAME:  Dawn Rivas, Dawn Rivas MR#:  [id number] DATE OF BIRTH:  12-Sep-1965  DATE OF PROCEDURE:  01/05/2014  PREOPERATIVE DIAGNOSIS: Left breast cancer status post neoadjuvant chemotherapy.   POSTOPERATIVE DIAGNOSIS: Left breast cancer status post neoadjuvant chemotherapy.   OPERATIVE PROCEDURE: Wide local excision with mastoplasty, sentinel node biopsy.   OPERATING SURGEON: Robert Bellow, MD   ANESTHESIA: General by LMA, Marcaine 0.5% with 1:200,000 units of epinephrine, 30 mL local infiltration.   ESTIMATED BLOOD LOSS: Minimal.  CLINICAL NOTE: This woman had a sizable lower inner quadrant left breast cancer and underwent neoadjuvant chemotherapy with complete clinical response. She underwent needle localization this morning as well as injection with technetium sulfur colloid for planned sentinel node biopsy and wide local excision. She received antibiotic prophylaxis.   OPERATIVE NOTE: With the patient under adequate general anesthesia, the breast and axilla were prepped with ChloraPrep and draped. Gamma finder was used to identify the area of increased uptake. Prior prepping of saline mixed with methylene blue was injected in the subareolar plexus. A transverse axillary incision was made and 2 hot blue nodes were identified. Gross frozen section exam showed no evidence of macrometastatic disease. The axilla was subsequently closed with 2-0 Vicryl suture to the deep layer and a running 4-0 Vicryl subcuticular suture for the skin.   Attention was then turned to the breast where the localizing wire was identified. Ultrasound was used to confirm location of the tip and anticipated location of the previous biopsy clip. A radial incision was made at the 7-8 o'clock position and extended from the base of the nipple almost to the inframammary fold. The skin was incised sharply and the remaining dissection completed with electrocautery. The adipose tissue was elevated off the underlying breast  parenchyma and a 4 x 5 x 5 cm block of tissue was orientated and sent for specimen radiograph, confirming the previously placed clip and the tip of the localizing wire. Gross examination showed no evidence of residual tumor. Mastoplasty was completed by elevating the breast off the underlying pectoralis fascia. The fascia was not mobilized due to the patient's previous history of subpectoral implants. The breast parenchyma was then mobilized circumferentially and approximated with interrupted 2-0 Vicryl figure-of-eight sutures in multiple layers. Skin flaps were elevated 3 cm in all directions to allow a tension-free closure of the skin with a running 4-0 Vicryl subcuticular suture. Benzoin and Steri-Strips were applied to both wounds. Fluff gauze, Kerlix, and an Ace wrap were then applied.  The patient tolerated the procedure well and was taken to the recovery room in stable condition.   ____________________________ Robert Bellow, MD jwb:ST D: 01/09/2014 21:31:00 ET T: 01/09/2014 23:53:09 ET JOB#: [id number]  cc: Trevor Iha R. Ma Hillock, MD Robert Bellow, MD, <Dictator>    Buffey Zabinski Amedeo Kinsman MD ELECTRONICALLY SIGNED 01/11/2014 8:42

## 2014-09-01 NOTE — Consult Note (Signed)
Brief Consult Note: Diagnosis: Breast Cancer on adjuvant chemotherapy.   Patient was seen by consultant.   Comments: Patient is currently receiving chemotherapy with Adriamycin and Cytoxan-cycle 3 on 09/08/13. Patient has had persisitent nausea and vomiting since then-despite aggressive antiemetic regimen. No resolution despite therapy with Ondansetron,Kytril,Dexamethasone as well as Scoploamine patch. Today tried Reglan and repeat dose Kytril. BMP- no evidence of dehydration. I am concerned that there may be unrelated GI issue such as hiatal hernia and would appreciate GI opinion/expertise./.  Electronic Signatures: Georges Mouse (MD)  (Signed 08-May-15 13:45)  Authored: Brief Consult Note   Last Updated: 08-May-15 13:45 by Georges Mouse (MD)

## 2014-11-07 ENCOUNTER — Encounter: Payer: Self-pay | Admitting: *Deleted

## 2014-12-17 ENCOUNTER — Encounter: Payer: Self-pay | Admitting: Oncology

## 2014-12-17 ENCOUNTER — Inpatient Hospital Stay: Payer: BLUE CROSS/BLUE SHIELD | Attending: Oncology | Admitting: Oncology

## 2014-12-17 VITALS — BP 125/80 | HR 83 | Temp 97.6°F | Resp 18 | Wt 155.2 lb

## 2014-12-17 DIAGNOSIS — C50912 Malignant neoplasm of unspecified site of left female breast: Secondary | ICD-10-CM | POA: Diagnosis not present

## 2014-12-17 DIAGNOSIS — Z170 Estrogen receptor positive status [ER+]: Secondary | ICD-10-CM | POA: Diagnosis not present

## 2014-12-17 DIAGNOSIS — D72819 Decreased white blood cell count, unspecified: Secondary | ICD-10-CM | POA: Diagnosis not present

## 2014-12-17 DIAGNOSIS — M8580 Other specified disorders of bone density and structure, unspecified site: Secondary | ICD-10-CM | POA: Diagnosis not present

## 2014-12-17 DIAGNOSIS — Z79899 Other long term (current) drug therapy: Secondary | ICD-10-CM

## 2014-12-17 DIAGNOSIS — Z79811 Long term (current) use of aromatase inhibitors: Secondary | ICD-10-CM

## 2014-12-17 NOTE — Progress Notes (Signed)
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    Chief Complaint  Patient presents with  . Follow-up    breast cancer    INTERVAL HISTORY: Patient returns to clinic today for routine evaluation. She is tolerating letrozole well without significant side effects. She has no neurologic complaints. She denies any recent fevers or illnesses. She has a good appetite and denies weight loss. She has no chest pain or shortness of breath. She denies any nausea, vomiting, constipation, or diarrhea. She has no urinary complaints. Patient offers no further specific complaints today.  REVIEW OF SYSTEMS:   Review of Systems  Constitutional: Negative.   Musculoskeletal: Negative.   Psychiatric/Behavioral: The patient is nervous/anxious.     As per HPI. Otherwise, a complete review of systems is negatve.  PAST MEDICAL HISTORY: Past Medical History  Diagnosis Date  . Cancer 2015    Left breast, 2 microscopic foci residual tumor: 3.5 mm and one 0.0 mm respectively. Sentinel nodes negative x3. Closest margin 4 mm. Definitive affective neoadjuvant chemotherapy identified both of the breast sentinel lymph node. YyPT1a, N0.  ER 5%, PR 5%, HER-2/neu is not amplified.  . Breast cancer     PAST SURGICAL HISTORY: Past Surgical History  Procedure Laterality Date  . Tubal ligation  1997  . Breast enhancement surgery  7 years ago  . Portacath placement  07/24/13  . Breast biopsy Left 2015  . Breast lumpectomy  Left 01/05/2014    Wide excision, mastoplasty, sentinel node biopsy.    FAMILY HISTORY Family History  Problem Relation Age of Onset  . Breast cancer Maternal Aunt   . Cervical cancer Mother   . Lung cancer Maternal Grandfather        ADVANCED DIRECTIVES:    HEALTH MAINTENANCE: History  Substance Use Topics  . Smoking status: Never Smoker   . Smokeless tobacco: Never Used  . Alcohol Use: Yes     Colonoscopy:  PAP:  Bone density:  Lipid panel:  Allergies  Allergen Reactions  . Penicillins Hives  . Sulfur Hives    Current Outpatient Prescriptions  Medication Sig Dispense Refill  . letrozole (FEMARA) 2.5 MG tablet     . omeprazole (PRILOSEC) 20 MG capsule Take 1 capsule by mouth daily.    . polyethylene glycol powder (GLYCOLAX/MIRALAX) powder 255 grams one bottle for colonoscopy prep (Patient not taking: Reported on 12/17/2014) 255 g 0  . RESTASIS 0.05 % ophthalmic emulsion Place 1 vial into both eyes 2 (two) times daily.     No current facility-administered medications for this visit.    OBJECTIVE: Filed Vitals:   12/17/14 1533  BP: 125/80  Pulse: 83  Temp: 97.6 F (36.4 C)  Resp: 18     Body mass index is 31.33 kg/(m^2).    ECOG FS:0 - Asymptomatic  General: Well-developed, well-nourished, no acute distress. Eyes: Pink conjunctiva, anicteric sclera. Breasts: Bilateral breast and axilla without lumps or masses. Lungs: Clear to auscultation bilaterally. Heart: Regular rate and rhythm. No rubs, murmurs, or gallops. Abdomen: Soft, nontender, nondistended. No organomegaly noted, normoactive bowel sounds.  Musculoskeletal: No edema, cyanosis, or clubbing. Neuro: Alert, answering all questions appropriately. Cranial nerves grossly intact. Skin: No rashes or petechiae noted. Psych: Normal affect.   LAB RESULTS:  Lab Results  Component Value Date   NA 141 12/15/2013   K 3.9 12/15/2013   CL 104 12/15/2013   CO2 27 12/15/2013   GLUCOSE 107* 12/15/2013    BUN 19* 12/15/2013   CREATININE 0.91 04/03/2014   CALCIUM 8.8 12/15/2013   PROT 7.3 04/03/2014   ALBUMIN 3.9 04/03/2014   AST 22 04/03/2014   ALT 52 04/03/2014   ALKPHOS 52 04/03/2014   BILITOT 0.5 04/03/2014   GFRNONAA >60 12/29/2013   GFRAA >60 12/29/2013    Lab Results  Component Value Date   WBC 2.9* 04/03/2014   NEUTROABS 1.7 04/03/2014   HGB 14.2 04/03/2014   HCT 41.5 04/03/2014   MCV 92 04/03/2014   PLT 182 04/03/2014     STUDIES: No results found.  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  PLAN:    1. Breast cancer: Patient completed neoadjuvant chemotherapy with dose dense AC and weekly Taxol in August 2015. She subsequently underwent lumpectomy followed by adjuvant XRT. She tolerated her treatments well without significant side effects. Continue letrozole and complete 5 years of treatment in November 2020. Her most recent mammogram on July 06, 2014 was reported as BI-RADS 2.  Return to clinic in 6 months for routine evaluation. 2. Osteopenia: Patient had a baseline bone mineral density in January 2016 which reported a T score of -1.9. Continue dietary calcium and vitamin D and repeat in one year.  2. Leukopenia: Chronic, secondary chemotherapy. Monitor.   Patient expressed understanding and was in agreement with this plan. She also understands that She can call clinic at any time with any questions, concerns, or complaints.   No matching staging information was found for the patient.  Lloyd Huger, MD   12/17/2014 3:43 PM

## 2014-12-17 NOTE — Progress Notes (Signed)
Patient here today for 6 month follow up regarding breast cancer. Patient denies any concerns today.

## 2015-01-07 ENCOUNTER — Ambulatory Visit: Payer: Self-pay | Admitting: General Surgery

## 2015-02-18 ENCOUNTER — Other Ambulatory Visit: Payer: Self-pay | Admitting: Family Medicine

## 2015-02-18 DIAGNOSIS — F419 Anxiety disorder, unspecified: Secondary | ICD-10-CM

## 2015-02-18 NOTE — Telephone Encounter (Signed)
Printed, please fax or call in to pharmacy. Thank you.   

## 2015-04-08 ENCOUNTER — Telehealth: Payer: Self-pay | Admitting: *Deleted

## 2015-04-08 MED ORDER — LETROZOLE 2.5 MG PO TABS: 2.5000 mg | ORAL_TABLET | Freq: Every day | ORAL | Status: DC

## 2015-04-08 NOTE — Telephone Encounter (Signed)
Escribed

## 2015-06-19 DIAGNOSIS — N926 Irregular menstruation, unspecified: Secondary | ICD-10-CM | POA: Insufficient documentation

## 2015-06-19 DIAGNOSIS — B001 Herpesviral vesicular dermatitis: Secondary | ICD-10-CM | POA: Insufficient documentation

## 2015-06-19 DIAGNOSIS — J302 Other seasonal allergic rhinitis: Secondary | ICD-10-CM | POA: Insufficient documentation

## 2015-06-19 DIAGNOSIS — C50919 Malignant neoplasm of unspecified site of unspecified female breast: Secondary | ICD-10-CM | POA: Insufficient documentation

## 2015-06-19 DIAGNOSIS — Z6826 Body mass index (BMI) 26.0-26.9, adult: Secondary | ICD-10-CM | POA: Insufficient documentation

## 2015-06-19 DIAGNOSIS — E7800 Pure hypercholesterolemia, unspecified: Secondary | ICD-10-CM | POA: Insufficient documentation

## 2015-06-19 DIAGNOSIS — Z6828 Body mass index (BMI) 28.0-28.9, adult: Secondary | ICD-10-CM | POA: Insufficient documentation

## 2015-06-19 DIAGNOSIS — G4700 Insomnia, unspecified: Secondary | ICD-10-CM | POA: Insufficient documentation

## 2015-06-19 DIAGNOSIS — N912 Amenorrhea, unspecified: Secondary | ICD-10-CM | POA: Insufficient documentation

## 2015-07-08 ENCOUNTER — Ambulatory Visit
Admission: RE | Admit: 2015-07-08 | Discharge: 2015-07-08 | Disposition: A | Discharge status: Home / Self Care | Payer: BLUE CROSS/BLUE SHIELD | Source: Ambulatory Visit | Attending: Oncology | Admitting: Oncology

## 2015-07-08 ENCOUNTER — Other Ambulatory Visit: Payer: Self-pay | Admitting: Oncology

## 2015-07-08 DIAGNOSIS — M8580 Other specified disorders of bone density and structure, unspecified site: Secondary | ICD-10-CM | POA: Diagnosis present

## 2015-07-08 DIAGNOSIS — C50912 Malignant neoplasm of unspecified site of left female breast: Secondary | ICD-10-CM

## 2015-07-08 DIAGNOSIS — M8588 Other specified disorders of bone density and structure, other site: Secondary | ICD-10-CM | POA: Diagnosis not present

## 2015-07-08 DIAGNOSIS — M85852 Other specified disorders of bone density and structure, left thigh: Secondary | ICD-10-CM | POA: Insufficient documentation

## 2015-07-08 DIAGNOSIS — M810 Age-related osteoporosis without current pathological fracture: Secondary | ICD-10-CM | POA: Insufficient documentation

## 2015-07-15 ENCOUNTER — Ambulatory Visit: Payer: BLUE CROSS/BLUE SHIELD | Admitting: Oncology

## 2015-07-16 ENCOUNTER — Encounter: Payer: Self-pay | Admitting: Family Medicine

## 2015-07-16 ENCOUNTER — Ambulatory Visit (INDEPENDENT_AMBULATORY_CARE_PROVIDER_SITE_OTHER): Payer: BLUE CROSS/BLUE SHIELD | Admitting: Family Medicine

## 2015-07-16 VITALS — BP 128/76 | HR 87 | Temp 97.4°F | Resp 16 | Ht 59.0 in | Wt 146.0 lb

## 2015-07-16 DIAGNOSIS — R7309 Other abnormal glucose: Secondary | ICD-10-CM

## 2015-07-16 DIAGNOSIS — F419 Anxiety disorder, unspecified: Secondary | ICD-10-CM | POA: Diagnosis not present

## 2015-07-16 DIAGNOSIS — R319 Hematuria, unspecified: Secondary | ICD-10-CM

## 2015-07-16 DIAGNOSIS — Z79899 Other long term (current) drug therapy: Secondary | ICD-10-CM | POA: Diagnosis not present

## 2015-07-16 DIAGNOSIS — Z0000 Encounter for general adult medical examination without abnormal findings: Secondary | ICD-10-CM | POA: Diagnosis not present

## 2015-07-16 DIAGNOSIS — G4700 Insomnia, unspecified: Secondary | ICD-10-CM

## 2015-07-16 DIAGNOSIS — K219 Gastro-esophageal reflux disease without esophagitis: Secondary | ICD-10-CM

## 2015-07-16 DIAGNOSIS — E7800 Pure hypercholesterolemia, unspecified: Secondary | ICD-10-CM

## 2015-07-16 DIAGNOSIS — M810 Age-related osteoporosis without current pathological fracture: Secondary | ICD-10-CM

## 2015-07-16 LAB — POCT URINALYSIS DIPSTICK
BILIRUBIN UA: NEGATIVE
GLUCOSE UA: NEGATIVE
KETONES UA: NEGATIVE
LEUKOCYTES UA: NEGATIVE
Nitrite, UA: NEGATIVE
PH UA: 6
Protein, UA: NEGATIVE
Spec Grav, UA: <=1.005
Urobilinogen, UA: 0.2

## 2015-07-16 MED ORDER — LORAZEPAM 1 MG PO TABS: 1.0000 mg | ORAL_TABLET | Freq: Every day | ORAL | Status: DC

## 2015-07-16 MED ORDER — VALACYCLOVIR HCL 1 G PO TABS: 2000.0000 mg | ORAL_TABLET | Freq: Two times a day (BID) | ORAL | Status: DC

## 2015-07-16 MED ORDER — LETROZOLE 2.5 MG PO TABS: 2.5000 mg | ORAL_TABLET | Freq: Every day | ORAL | Status: DC

## 2015-07-16 NOTE — Progress Notes (Signed)
Patient ID: Dawn Rivas, female   DOB: 11-09-1965, 51 y.o.   MRN: [id number]       Patient: Dawn Rivas, Female    DOB: [birth year], 51 y.o.   MRN: [id number] Visit Date: 07/16/2015  Today's Provider: Margarita Rana, MD   Chief Complaint  Patient presents with  . Annual Exam   Subjective:    Annual physical exam Dawn Rivas is a 50 y.o. female who presents today for health maintenance and complete physical. She feels well. She reports exercising 5 days a week. She reports she is sleeping fairly well, with medication 8 hours. 07/25/14 CPE 06/23/13 Pap-neg; HPV-neg 07/08/15 Mammogram-BI-RADS 2 08/01/14 Colonoscopy-normal  07/08/11 BMD  Lab Results  Component Value Date   WBC 3.7 08/01/2014   HGB 15.4 08/01/2014   HCT 44 08/01/2014   PLT 248 08/01/2014   GLUCOSE 107* 12/15/2013   CHOL 233* 08/01/2014   TRIG 116 08/01/2014   HDL 54 08/01/2014   LDLCALC 156 08/01/2014   ALT 46* 08/01/2014   AST 32 08/01/2014   NA 142 08/01/2014   K 5.0 08/01/2014   CL 104 12/15/2013   CREATININE 0.9 08/01/2014   BUN 12 08/01/2014   CO2 27 12/15/2013   TSH 2.58 08/01/2014   -----------------------------------------------------------------  Also is on an aromatase inhibitor. Did have bone density that showed some changes, particularly in spine.  Frax was 5.7 and .8 percent. Has not had recent Vit D checked. Is not on high doses of D. Is tolerating medication.  Review of Systems  Constitutional: Negative.   HENT: Negative.   Eyes: Negative.   Respiratory: Negative.   Cardiovascular: Negative.   Gastrointestinal: Negative.   Endocrine: Negative.   Genitourinary: Negative.   Musculoskeletal: Negative.   Skin: Negative.   Allergic/Immunologic: Negative.   Neurological: Negative.   Hematological: Negative.   Psychiatric/Behavioral: Negative.     Social History      She  reports that she has never smoked. She has never used smokeless tobacco. She reports that she drinks alcohol.  She reports that she does not use illicit drugs.       Social History   Social History  . Marital Status: Married    Spouse Name: N/A  . Number of Children: N/A  . Years of Education: N/A   Social History Main Topics  . Smoking status: Never Smoker   . Smokeless tobacco: Never Used  . Alcohol Use: Yes  . Drug Use: No  . Sexual Activity: Not Asked   Other Topics Concern  . None   Social History Narrative    Past Medical History  Diagnosis Date  . Cancer ([place]) 2015    Left breast, 2 microscopic foci residual tumor: 3.5 mm and one 0.0 mm respectively. Sentinel nodes negative x3. Closest margin 4 mm. Definitive affective neoadjuvant chemotherapy identified both of the breast sentinel lymph node. YyPT1a, N0.  ER 5%, PR 5%, HER-2/neu is not amplified.  . Breast cancer [facility]) 2015    left     Patient Active Problem List   Diagnosis Date Noted  . Hematuria 07/16/2015  . Abnormal blood sugar 07/16/2015  . High risk medication use 07/16/2015  . Osteoporosis 07/16/2015  . Absence of menstruation 06/19/2015  . Adult BMI 30+ 06/19/2015  . Breast carcinoma ([place]) 06/19/2015  . Cold sore 06/19/2015  . Calcium blood increased 06/19/2015  . Cannot sleep 06/19/2015  . Irregular bleeding 06/19/2015  . Hypercholesterolemia without hypertriglyceridemia 06/19/2015  . Allergic rhinitis, seasonal  06/19/2015  . Anxiety 02/18/2015  . Encounter for screening colonoscopy 07/10/2014  . Breast cancer ([place]) 07/17/2013    Past Surgical History  Procedure Laterality Date  . Tubal ligation  1997  . Breast enhancement surgery  7 years ago  . Portacath placement  07/24/13  . Breast biopsy Left 2015  . Breast lumpectomy Left 01/05/2014    Wide excision, mastoplasty, sentinel node biopsy.  Marland Kitchen Upper gi endoscopy  09/28/13    hiatus hernia, la grade a reflux esophagitis, normal stomach, normal duodenum  . Augmentation mammaplasty Bilateral     breast implants    Family History        Family  Status  Relation Status Death Age  . Mother Alive   . Maternal Grandfather Deceased 16  . Father Alive   . Sister Alive   . Brother Alive   . Son Alive   . Maternal Grandmother Alive   . Paternal Grandmother Deceased 33  . Paternal Grandfather Deceased 67  . Son Alive   . Son Alive         Her family history includes Alzheimer's disease in her paternal grandmother; Breast cancer in her maternal aunt; Cervical cancer in her mother; Heart attack in her father and paternal grandfather; Heart disease in her father; Lung cancer in her maternal grandfather; Stroke in her paternal grandmother; Uterine cancer in her mother.    Allergies  Allergen Reactions  . Penicillins Hives  . Sulfur Hives    Previous Medications   LETROZOLE (FEMARA) 2.5 MG TABLET    Take 1 tablet (2.5 mg total) by mouth daily.   LORAZEPAM (ATIVAN) 1 MG TABLET    TAKE 1/2 TO 1 TABLET BY MOUTH EVERY NIGHT AS NEEDED FOR INSOMNIA   OMEPRAZOLE (PRILOSEC) 20 MG CAPSULE    Take 1 capsule by mouth daily.   VALACYCLOVIR (VALTREX) 1000 MG TABLET    Take by mouth.    Patient Care Team: Margarita Rana, MD as PCP - General (Family Medicine) Robert Bellow, MD (General Surgery) Margarita Rana, MD as Referring Physician (Family Medicine) Kennieth Francois, MD as Consulting Physician (Hematology and Oncology) Lloyd Huger, MD as Consulting Physician (Oncology)     Objective:   Vitals: BP 128/76 mmHg  Pulse 87  Temp(Src) 97.4 F (36.3 C) (Oral)  Resp 16  Ht '4\' 11"'  (1.499 m)  Wt 146 lb (66.225 kg)  BMI 29.47 kg/m2   Physical Exam  Constitutional: She is oriented to person, place, and time. She appears well-developed and well-nourished.  HENT:  Head: Normocephalic and atraumatic.  Right Ear: Tympanic membrane, external ear and ear canal normal.  Left Ear: Tympanic membrane, external ear and ear canal normal.  Nose: Nose normal.  Mouth/Throat: Uvula is midline, oropharynx is clear and moist and mucous membranes are  normal.  Eyes: Conjunctivae, EOM and lids are normal. Pupils are equal, round, and reactive to light.  Neck: Trachea normal and normal range of motion. Neck supple. Carotid bruit is not present. No thyroid mass and no thyromegaly present.  Cardiovascular: Normal rate, regular rhythm and normal heart sounds.   Pulmonary/Chest: Effort normal and breath sounds normal.  Abdominal: Soft. Normal appearance and bowel sounds are normal. There is no hepatosplenomegaly. There is no tenderness.  Musculoskeletal: Normal range of motion.  Lymphadenopathy:    She has no cervical adenopathy.    She has no axillary adenopathy.  Neurological: She is alert and oriented to person, place, and time. She has normal strength. No  cranial nerve deficit.  Skin: Skin is warm, dry and intact.  Psychiatric: She has a normal mood and affect. Her speech is normal and behavior is normal. Judgment and thought content normal. Cognition and memory are normal.     Assessment & Plan:     Routine Health Maintenance and Physical Exam  Exercise Activities and Dietary recommendations Goals    None      Immunization History  Administered Date(s) Administered  . Tdap 01/07/2012        1. Annual physical exam Stable. Patient advised to continue eating healthy and exercise daily. - POCT urinalysis dipstick  2. Hematuria F/U pending lab report. - Urine Microscopic  3. Hypercholesterolemia without hypertriglyceridemia - CBC with Differential/Platelet - Lipid Panel With LDL/HDL Ratio  4. Abnormal blood sugar - Hemoglobin A1c  5. Calcium blood increased - Comprehensive metabolic panel  6. Cannot sleep - TSH  7. High risk medication use - VITAMIN D 25 Hydroxy (Vit-D Deficiency, Fractures)  8. Osteoporosis Will check Vit D. Is some evidence that high doses will prevent side effects and osteoporosis. Repeat bone density next year.   - VITAMIN D 25 Hydroxy (Vit-D Deficiency, Fractures)    Patient seen  and examined by Dr. Jerrell Belfast, and note scribed by Philbert Riser. Dimas, CMA.  I have reviewed the document for accuracy and completeness and I agree with above. Jerrell Belfast, MD   Margarita Rana, MD   --------------------------------------------------------------------

## 2015-07-17 LAB — URINALYSIS, MICROSCOPIC ONLY
Bacteria, UA: NONE SEEN
CASTS: NONE SEEN /LPF
RBC MICROSCOPIC, UA: NONE SEEN /HPF (ref 0–?)

## 2015-07-20 LAB — COMPREHENSIVE METABOLIC PANEL
ALBUMIN: 4.9 g/dL (ref 3.5–5.5)
ALK PHOS: 55 IU/L (ref 39–117)
ALT: 49 IU/L — AB (ref 0–32)
AST: 30 IU/L (ref 0–40)
Albumin/Globulin Ratio: 1.8 (ref 1.1–2.5)
BILIRUBIN TOTAL: 0.6 mg/dL (ref 0.0–1.2)
BUN / CREAT RATIO: 21 (ref 9–23)
BUN: 17 mg/dL (ref 6–24)
CHLORIDE: 99 mmol/L (ref 96–106)
CO2: 24 mmol/L (ref 18–29)
CREATININE: 0.81 mg/dL (ref 0.57–1.00)
Calcium: 10.2 mg/dL (ref 8.7–10.2)
GFR calc Af Amer: 97 mL/min/{1.73_m2} (ref >59)
GFR calc non Af Amer: 84 mL/min/{1.73_m2} (ref >59)
GLUCOSE: 91 mg/dL (ref 65–99)
Globulin, Total: 2.8 g/dL (ref 1.5–4.5)
Potassium: 4.8 mmol/L (ref 3.5–5.2)
Sodium: 141 mmol/L (ref 134–144)
TOTAL PROTEIN: 7.7 g/dL (ref 6.0–8.5)

## 2015-07-20 LAB — CBC WITH DIFFERENTIAL/PLATELET
BASOS ABS: 0 10*3/uL (ref 0.0–0.2)
Basos: 1 %
EOS (ABSOLUTE): 0.1 10*3/uL (ref 0.0–0.4)
Eos: 2 %
Hematocrit: 43.8 % (ref 34.0–46.6)
Hemoglobin: 15.3 g/dL (ref 11.1–15.9)
IMMATURE GRANULOCYTES: 0 %
Immature Grans (Abs): 0 10*3/uL (ref 0.0–0.1)
LYMPHS ABS: 1.5 10*3/uL (ref 0.7–3.1)
Lymphs: 42 %
MCH: 32.8 pg (ref 26.6–33.0)
MCHC: 34.9 g/dL (ref 31.5–35.7)
MCV: 94 fL (ref 79–97)
MONOS ABS: 0.3 10*3/uL (ref 0.1–0.9)
Monocytes: 10 %
NEUTROS PCT: 45 %
Neutrophils Absolute: 1.6 10*3/uL (ref 1.4–7.0)
PLATELETS: 240 10*3/uL (ref 150–379)
RBC: 4.66 x10E6/uL (ref 3.77–5.28)
RDW: 13.8 % (ref 12.3–15.4)
WBC: 3.5 10*3/uL (ref 3.4–10.8)

## 2015-07-20 LAB — HEMOGLOBIN A1C
Est. average glucose Bld gHb Est-mCnc: 105 mg/dL
HEMOGLOBIN A1C: 5.3 % (ref 4.8–5.6)

## 2015-07-20 LAB — LIPID PANEL WITH LDL/HDL RATIO
CHOLESTEROL TOTAL: 231 mg/dL — AB (ref 100–199)
HDL: 50 mg/dL (ref >39)
LDL Calculated: 159 mg/dL — ABNORMAL HIGH (ref 0–99)
LDL/HDL RATIO: 3.2 ratio (ref 0.0–3.2)
TRIGLYCERIDES: 111 mg/dL (ref 0–149)
VLDL Cholesterol Cal: 22 mg/dL (ref 5–40)

## 2015-07-20 LAB — TSH: TSH: 2.26 u[IU]/mL (ref 0.450–4.500)

## 2015-07-20 LAB — VITAMIN D 25 HYDROXY (VIT D DEFICIENCY, FRACTURES): Vit D, 25-Hydroxy: 33.8 ng/mL (ref 30.0–100.0)

## 2015-07-31 ENCOUNTER — Telehealth: Payer: Self-pay | Admitting: Family Medicine

## 2015-07-31 DIAGNOSIS — R748 Abnormal levels of other serum enzymes: Secondary | ICD-10-CM

## 2015-07-31 NOTE — Telephone Encounter (Signed)
Pt had labs done on 3/10 and has not heard back on them.  Her call back is [phone].  Thanks Con Memos

## 2015-08-01 NOTE — Telephone Encounter (Signed)
There is endoscopy in [facility] in may 2015 per notes from GI it was done due to vomiting she had-results showed hiatus hernia and reflux esophagitis it was not due to elevated liver enzymes per notes-aa

## 2015-08-01 NOTE — Telephone Encounter (Signed)
Advised patient of results. Patient reports that she think she has had a EGD done in the past, but doesn't know if it was related to abnormal LFTs. She reports that it was by Dr. Allen Norris in [place].        Notes Recorded by Margarita Rana, MD on 07/21/2015 at 7:01 PM Labs fairly stable. Cholesterol mildly elevated at 231, but with 10 year risk of heart disease only at 2,3 percent and medication is not indicated at this time. Eat healthy and exercise. Liver enzyme still mildly elevated. Please see if patient has had work up for this previously. Also Vit d normal but not in range that we had talked about to prevent osteoporosis. Please have patient take OTC Vit D (better than Rx) 2000 iu bid and recheck vit d in 6 weeks. Thanks. Make sure then patient gets level back to remind [place] goal is over 60. Thanks

## 2015-08-01 NOTE — Telephone Encounter (Signed)
Please see if we can find this information. Request records. Also have patient call back in 2 to 3 weeks if she has not heard back from [place]. Thanks.

## 2015-08-01 NOTE — Telephone Encounter (Signed)
Left message to call back. Result note is attached to labs. Thanks!

## 2015-08-13 ENCOUNTER — Encounter: Payer: Self-pay | Admitting: General Surgery

## 2015-08-13 ENCOUNTER — Ambulatory Visit (INDEPENDENT_AMBULATORY_CARE_PROVIDER_SITE_OTHER): Payer: BLUE CROSS/BLUE SHIELD | Admitting: General Surgery

## 2015-08-13 VITALS — BP 122/78 | HR 76 | Resp 12 | Ht 59.0 in | Wt 142.0 lb

## 2015-08-13 DIAGNOSIS — C50912 Malignant neoplasm of unspecified site of left female breast: Secondary | ICD-10-CM | POA: Diagnosis not present

## 2015-08-13 DIAGNOSIS — M810 Age-related osteoporosis without current pathological fracture: Secondary | ICD-10-CM

## 2015-08-13 MED ORDER — TAMOXIFEN CITRATE 20 MG PO TABS
20.0000 mg | ORAL_TABLET | Freq: Every day | ORAL | Status: DC
Start: 2015-08-13 — End: 2016-07-16

## 2015-08-13 NOTE — Patient Instructions (Signed)
Return in one year.

## 2015-08-13 NOTE — Progress Notes (Signed)
Patient ID: Dawn Rivas, female   DOB: 18-Sep-1965, 50 y.o.   MRN: [id number]  Chief Complaint  Patient presents with  . Other    results of bone density test  . Medication Refill    Letrozole    HPI Dawn Rivas is a 50 y.o. female here today to discuss results of bone density test done on 07/08/15 and medication refill.  HPI  Past Medical History  Diagnosis Date  . Cancer ([place]) 2015    Left breast, 2 microscopic foci residual tumor: 3.5 mm and one 0.0 mm respectively. Sentinel nodes negative x3. Closest margin 4 mm. Definitive affective neoadjuvant chemotherapy identified both of the breast sentinel lymph node. YyPT1a, N0.  ER 5%, PR 5%, HER-2/neu is not amplified.  . Breast cancer [facility]) 2015    left    Past Surgical History  Procedure Laterality Date  . Tubal ligation  1997  . Breast enhancement surgery  7 years ago  . Portacath placement  07/24/13  . Breast biopsy Left 2015  . Breast lumpectomy Left 01/05/2014    Wide excision, mastoplasty, sentinel node biopsy.  Marland Kitchen Upper gi endoscopy  09/28/13    hiatus hernia, la grade a reflux esophagitis, normal stomach, normal duodenum  . Augmentation mammaplasty Bilateral     breast implants    Family History  Problem Relation Age of Onset  . Breast cancer Maternal Aunt   . Cervical cancer Mother   . Uterine cancer Mother   . Lung cancer Maternal Grandfather   . Heart disease Father   . Heart attack Father   . Stroke Paternal Grandmother   . Alzheimer's disease Paternal Grandmother   . Heart attack Paternal Grandfather     Social History Social History  Substance Use Topics  . Smoking status: Never Smoker   . Smokeless tobacco: Never Used  . Alcohol Use: Yes    Allergies  Allergen Reactions  . Penicillins Hives  . Sulfur Hives    Current Outpatient Prescriptions  Medication Sig Dispense Refill  . letrozole (FEMARA) 2.5 MG tablet Take 1 tablet (2.5 mg total) by mouth daily. 90 tablet 3  . LORazepam (ATIVAN) 1 MG  tablet Take 1 tablet (1 mg total) by mouth at bedtime. 30 tablet 5  . omeprazole (PRILOSEC) 20 MG capsule Take 1 capsule by mouth daily.    . valACYclovir (VALTREX) 1000 MG tablet Take 2 tablets (2,000 mg total) by mouth 2 (two) times daily. 16 tablet 3  . tamoxifen (NOLVADEX) 20 MG tablet Take 1 tablet (20 mg total) by mouth daily. 90 tablet 3   No current facility-administered medications for this visit.    Review of Systems Review of Systems  Constitutional: Negative.   Respiratory: Negative.   Cardiovascular: Negative.     Blood pressure 122/78, pulse 76, resp. rate 12, height '4\' 11"'  (1.499 m), weight 142 lb (64.411 kg).  Physical Exam Physical Exam  Constitutional: She is oriented to person, place, and time. She appears well-developed and well-nourished.  Eyes: Conjunctivae are normal. No scleral icterus.  Neck: Neck supple.  Cardiovascular: Normal rate, regular rhythm and normal heart sounds.   Pulmonary/Chest: Effort normal and breath sounds normal. Right breast exhibits no inverted nipple, no mass, no nipple discharge, no skin change and no tenderness. Left breast exhibits no inverted nipple, no mass, no nipple discharge, no skin change and no tenderness.    Left breast well healed incision at 7 o'clock.  Lymphadenopathy:    She has no  cervical adenopathy.    She has no axillary adenopathy.  Neurological: She is alert and oriented to person, place, and time.  Skin: Skin is warm and dry.    Data Reviewed Bone scan dated 07/08/2015 requested by Delight Hoh M.D. showed a worsening of her spine and neck scores from osteopenia to osteoporosis over the past year.  Bilateral diagnostic mammograms dated 07/08/2015 were reviewed. Postsurgical changes. BI-RADS 2.  Original tumor was ER 5%, PR negative, HER-2/neu not overexpressed.  Assessment    Benign breast exam.  Progression of osteopenia to osteoporosis.    Plan    Options for management of her osteoporosis  were reviewed area first of all, she is not making use of supplemental calcium. The need to make use of 1200 mg with vitamin D daily with dietary sources as a "bonus" was emphasized.  Aromatase inhibitors are notorious for worsening bone loss. As she had a very low ER positive tumor, it is questionable whether the increased risk for bone loss is off set by significant gain in tumor suppression. Options here include 1) cessation of letrozole, 2) continuation of letrozole making use of a bisphosphonate in addition to help improve her bone density or 3) initiation of tamoxifen and place of letrozole with repeat bone density in one year.  Pros and cons of each protocol was reviewed. Risks associated with tamoxifen use including DVT/PE as well as uterine cancer were reviewed. The importance of probably reporting any leg swelling or unexplained shortness of breath as well as any new vaginal bleeding was emphasized.  She reports that during her last visit with Dr. Grayland Ormond in August 2016 that if she had difficulty with the left resolved could be discontinued. This is not included in the dictated note from that visit.  The patient will follow-up in fall 2017 as previously scheduled with Dr. Grayland Ormond and will ask him to order repeat bone density in February 2018 as well as bilateral diagnostic mammograms.  The patient again has been encouraged to call promptly if she has any unexplained leg swelling, shortness of breath or vaginal bleeding. Follow up otherwise will be in one year for a clinical exam.    PCP:  Margarita Rana This information has been scribed by Gaspar Cola CMA.    Robert Bellow 08/14/2015, 4:04 PM

## 2015-08-19 NOTE — Telephone Encounter (Signed)
Recommend recheck liver enzymes and GGT and may need referral to GI if still elevated. Thanks.

## 2015-08-20 NOTE — Telephone Encounter (Signed)
Pt advised and labs slip left up front-aa

## 2015-08-20 NOTE — Telephone Encounter (Signed)
[id number] 

## 2015-08-22 ENCOUNTER — Telehealth: Payer: Self-pay

## 2015-08-22 LAB — ALT: ALT: 20 IU/L (ref 0–32)

## 2015-08-22 LAB — GAMMA GT: GGT: 18 IU/L (ref 0–60)

## 2015-08-22 LAB — AST: AST: 20 IU/L (ref 0–40)

## 2015-08-22 NOTE — Telephone Encounter (Signed)
Pt advised.   Thanks,   -Laban Orourke  

## 2015-08-22 NOTE — Telephone Encounter (Signed)
-----   Message from Margarita Rana, MD sent at 08/22/2015  7:11 AM EDT ----- Liver enzymes back to normal.  No need for further work up. Thanks.

## 2015-08-22 NOTE — Telephone Encounter (Signed)
LMTCB 08/22/2015  Thanks,   -Mickel Baas

## 2015-08-25 IMAGING — US US BREAST*L* LIMITED INC AXILLA
1 series · 11 of 11 positions shown · non-contrast
Comparison: Previous exams.

CLINICAL DATA: Patient presents for a bilateral diagnostic exam to
evaluate a new palpable abnormality over the left breast.

EXAM:
DIGITAL DIAGNOSTIC  bilateral MAMMOGRAM WITH CAD
ULTRASOUND left foot BREAST

[Series 1: us breast*left* limited inc axilla · 0.08mm/px · 11 of 11 slices shown]
[im 1/11]
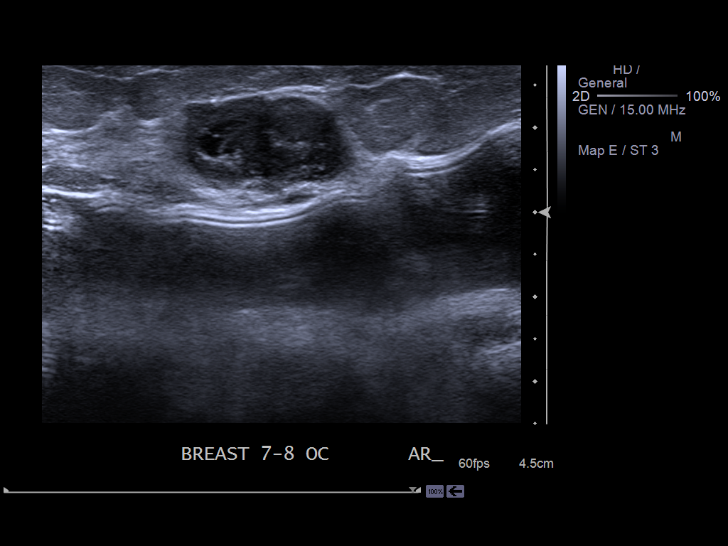
[im 2/11]
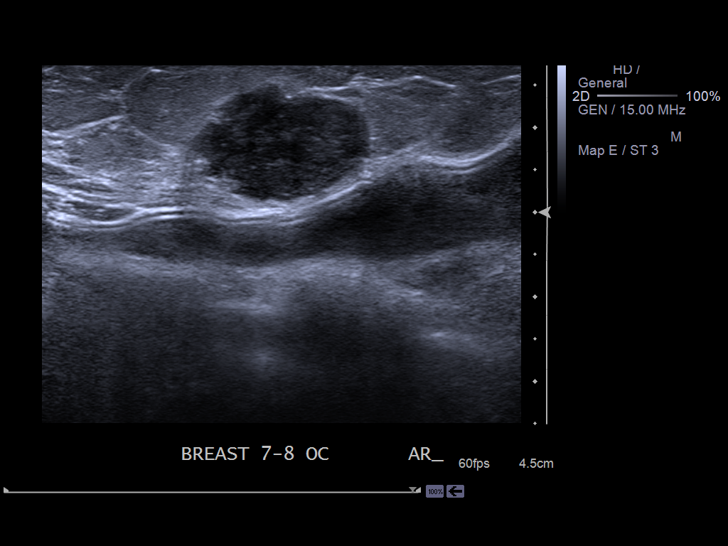
[im 3/11]
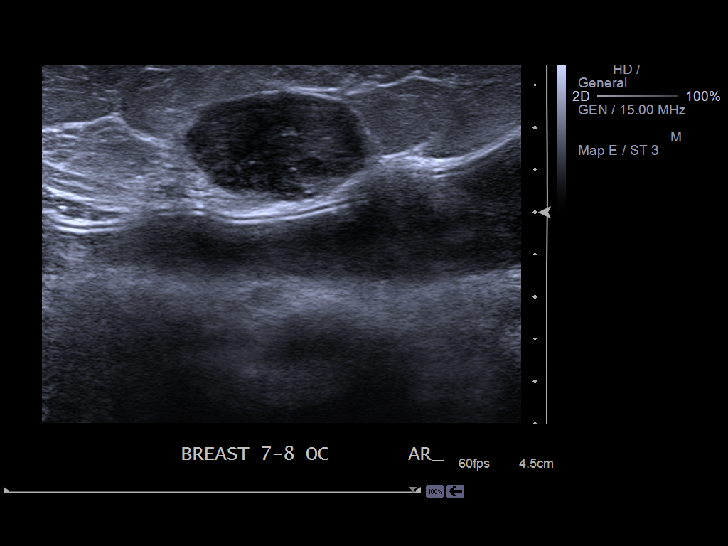
[im 4/11]
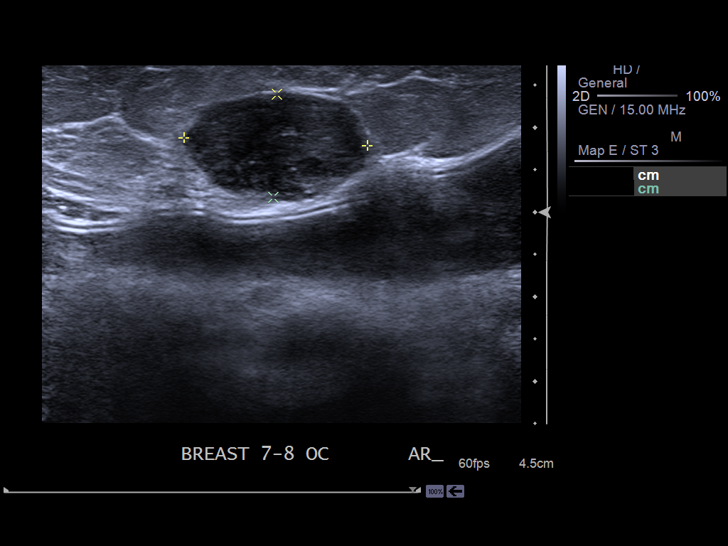
[im 5/11]
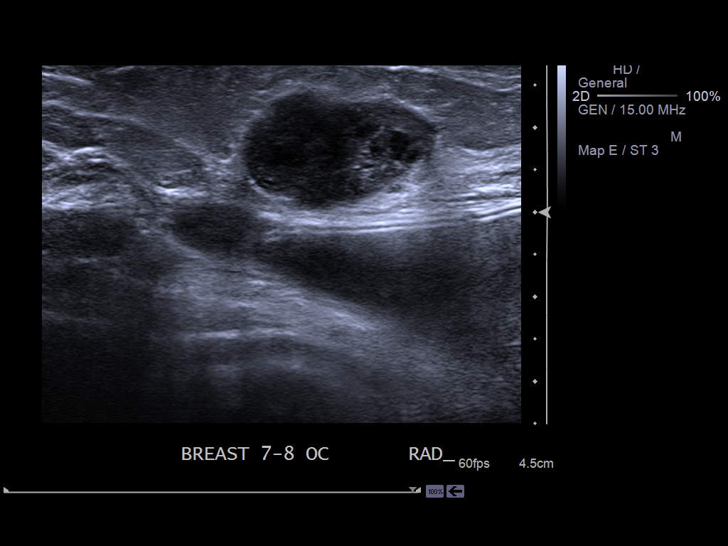
[im 6/11]
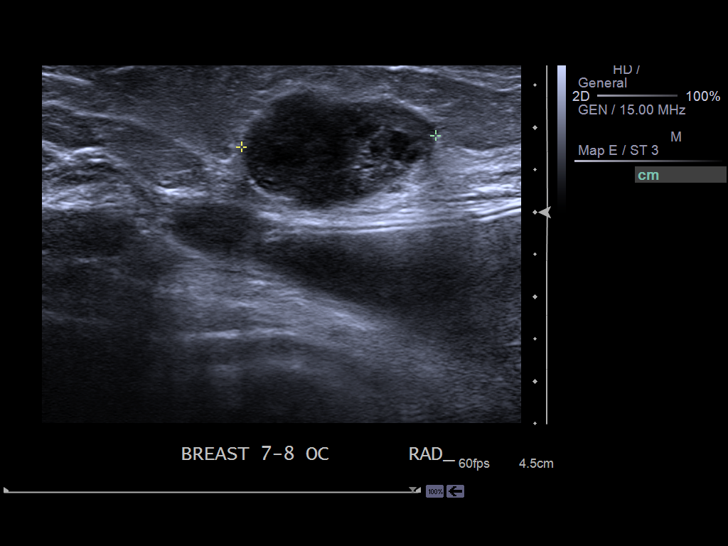
[im 7/11]
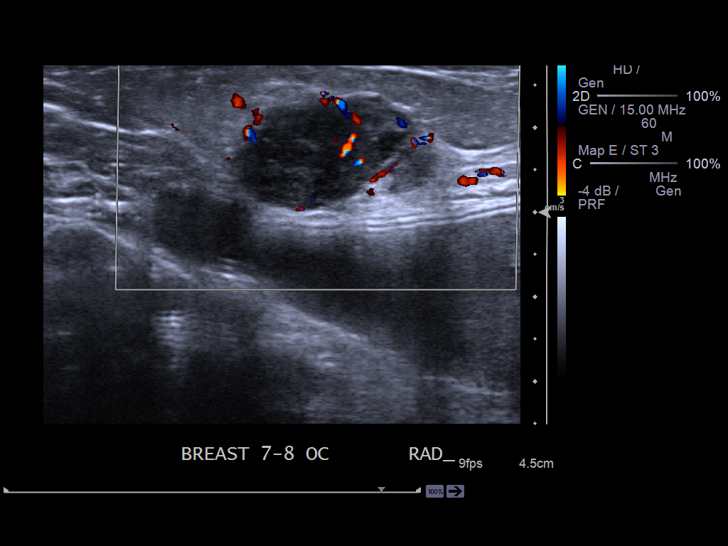
[im 8/11]
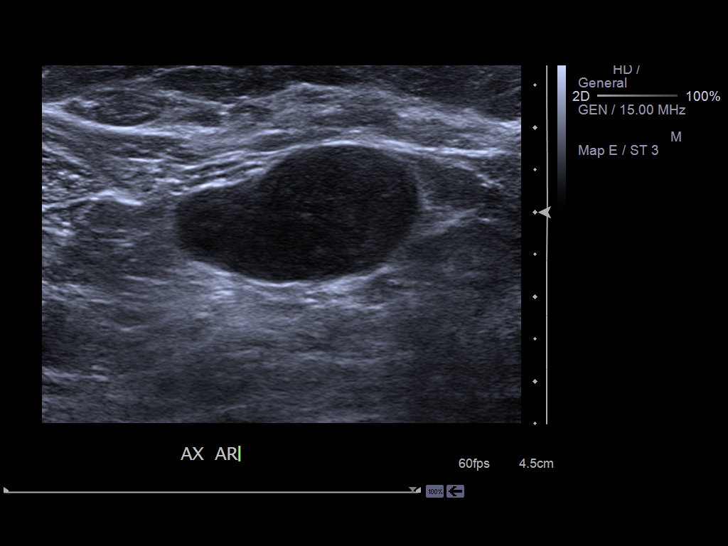
[im 9/11]
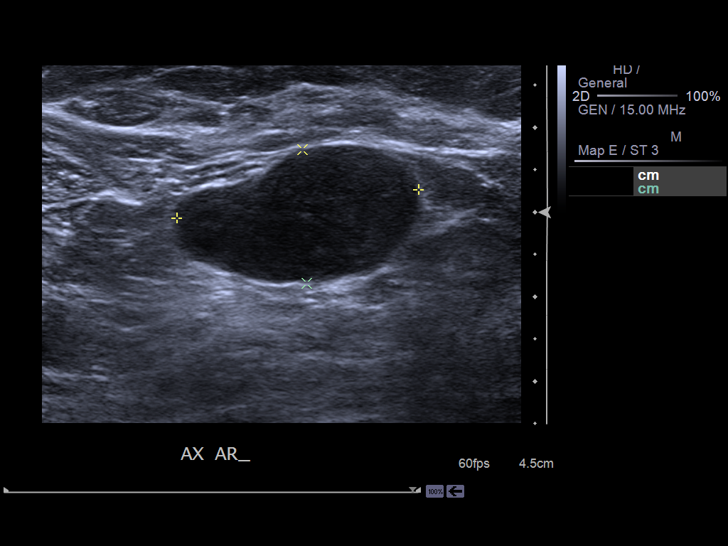
[im 10/11]
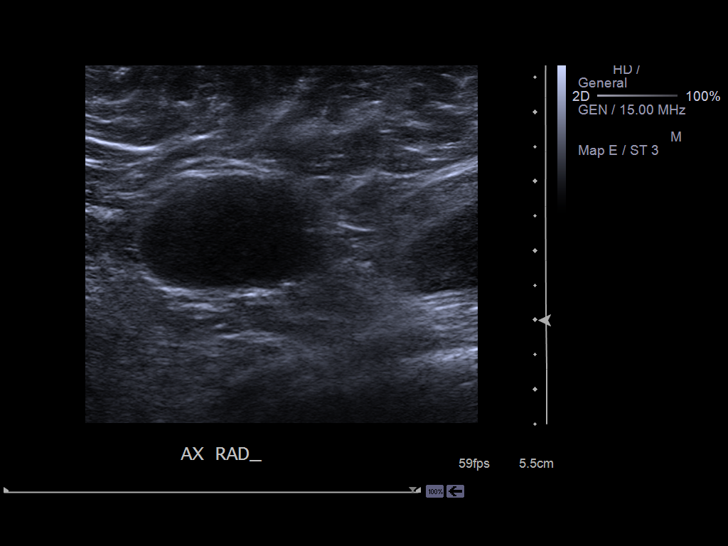
[im 11/11]
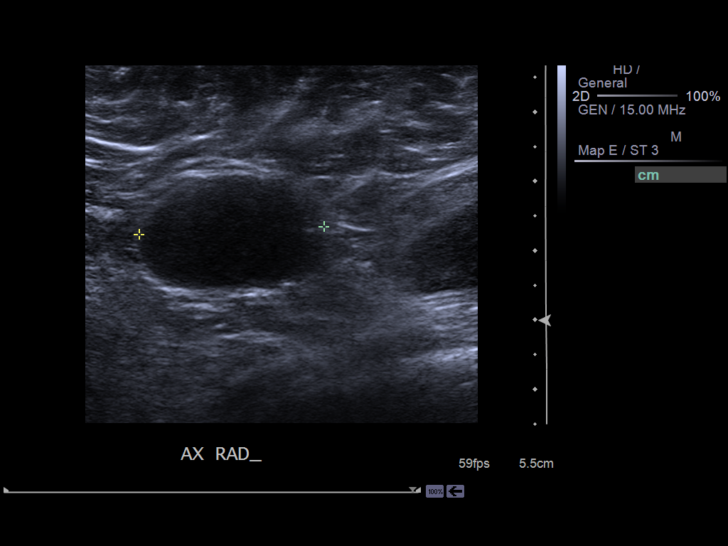

[11 of 11 positions shown; findings below may reference images not displayed]

ACR Breast Density Category b: There are scattered areas of
fibroglandular density.
FINDINGS: Examination demonstrates bilateral retropectoral saline implants
intact and unchanged. There is a mass over the inner lower left
breast corresponding to patient's palpable abnormality. This mass is
ovoid in shape with some borders which are indistinct and some which
are circumscribed.

Mammographic images were processed with CAD.

On physical exam, I palpate a somewhat mobile ovoid 2 cm mass over
the 7 to 8 o'clock position of the left breast approximately 7 cm
from the nipple.

Ultrasound is performed, showing an ovoid hypoechoic somewhat
heterogeneous mass at the 7 to 8 o'clock position of the left breast
7 cm from the nipple corresponding to patient's palpable
abnormality. This has a parallel long axis and has some internal
cystic areas as well as some areas where the borders are more
irregular and angulated. This mass measures 1.2 x 2.2 x 2.3 cm.

Ultrasound of the left axilla demonstrates an abnormal lower
axillary lymph node completely hypoechoic measuring 1.6 x 2.7 x
cm.
IMPRESSION: Suspicious ovoid somewhat heterogeneous hypoechoic mass at the 7 to
8 o'clock position of the left breast 7 cm from the nipple
corresponding to patient's palpable abnormality. Abnormal lower left
axillary lymph node.

RECOMMENDATION:
Recommend ultrasound-guided core needle biopsy of this suspicious
mass and abnormal lymph node.

I have discussed the findings and recommendations with the patient.
Results were also provided in writing at the conclusion of the
visit. If applicable, a reminder letter will be sent to the patient
regarding the next appointment.

BI-RADS CATEGORY  4: Suspicious abnormality - biopsy should be
considered.

Biopsy scheduled for [REDACTED] 07/10/2013 at 9 a.m..

## 2015-08-30 IMAGING — MG MM POST US BIOPSY *L*
1 series · 4 of 4 positions shown · non-contrast
Comparison: Previous exams

ADDENDUM:
Invasive mammary carcinoma was reported from the mass in the 8
o'clock region of the left breast. Metastatic disease was seen in
the left axillary lymph node. Pathology correlates with imaging
findings. The patient was contacted by telephone and given the
results of the biopsies. She states the biopsy sites are clean and
dry with no signs of hematoma or infection. The patient is seeing
Dr. Mikaila to discuss her results and arrange treatment planning.
CLINICAL DATA: Status post ultrasound-guided core biopsy left
breast mass

EXAM:
DIAGNOSTIC LEFT MAMMOGRAM POST ULTRASOUND BIOPSY

[L CC · left · 4 of 4 slices shown]
[im 1/4]
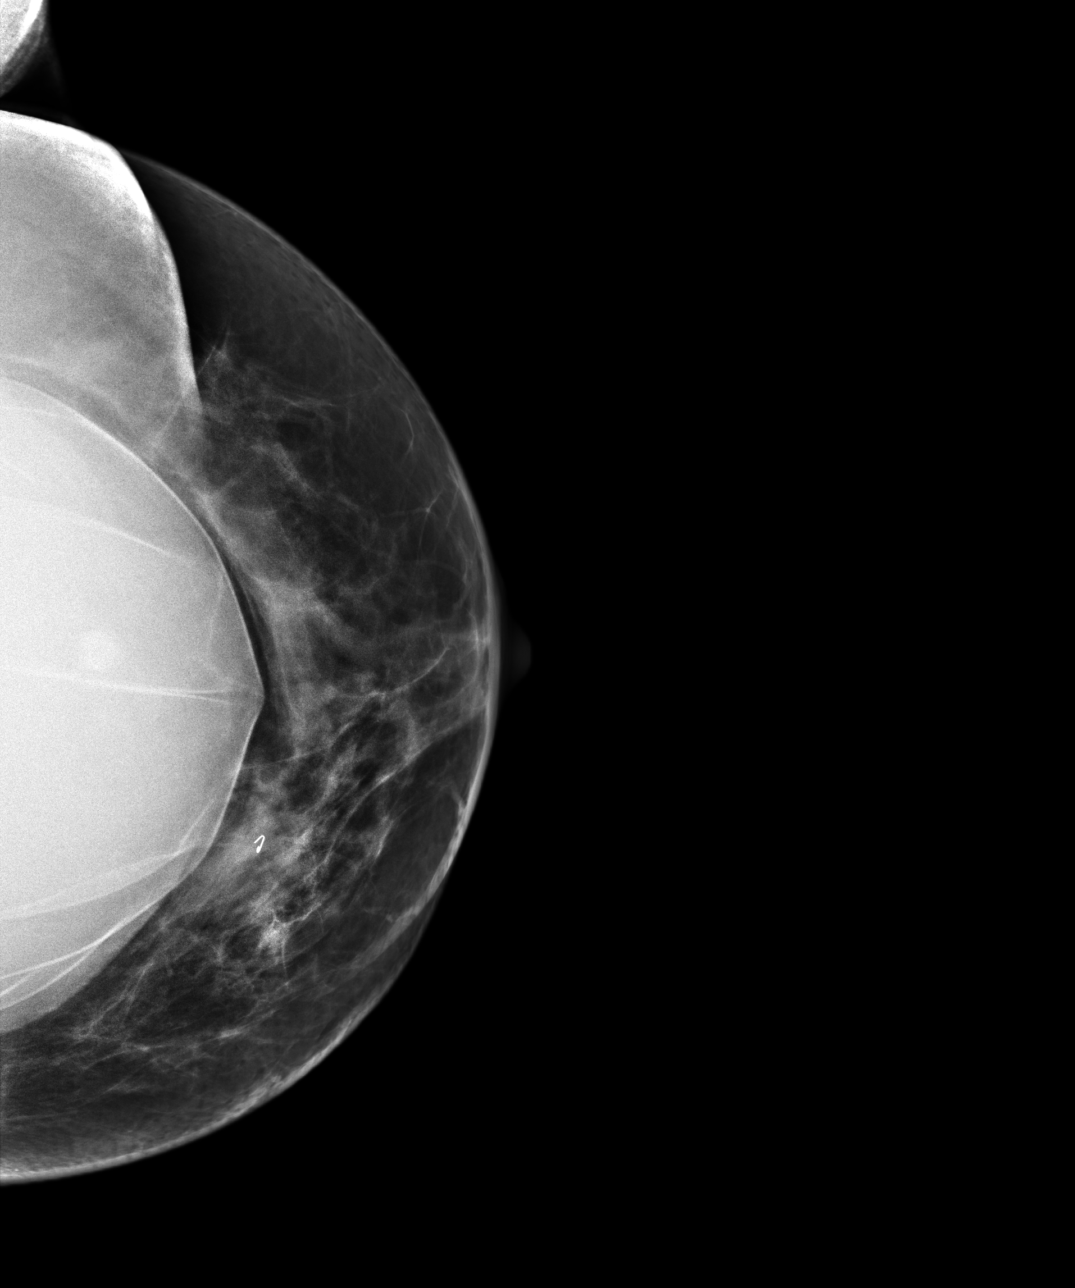
[im 2/4]
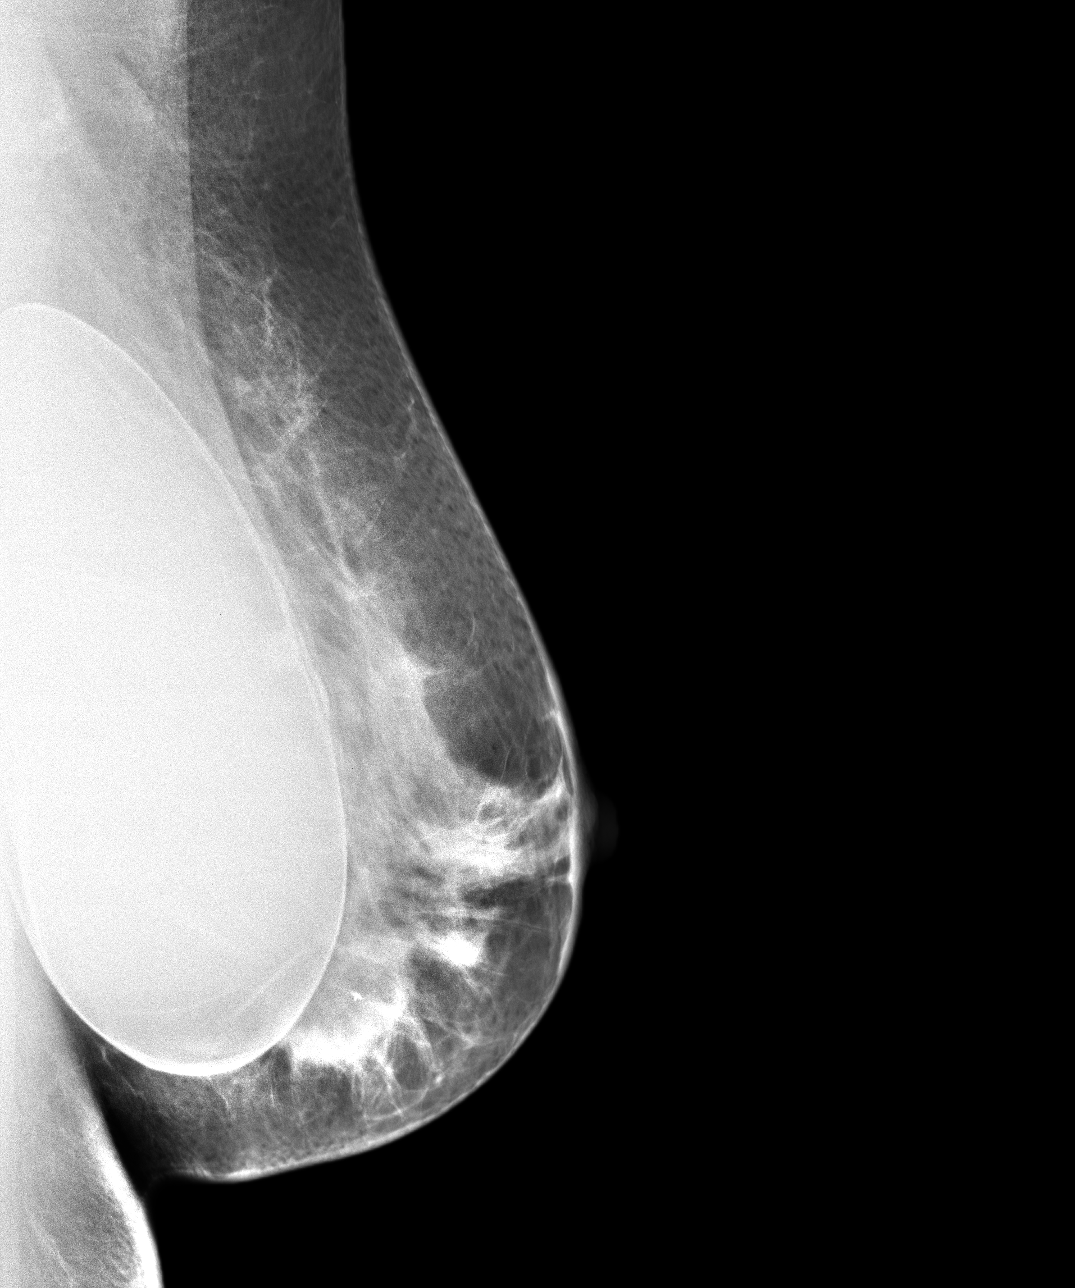
[im 3/4]
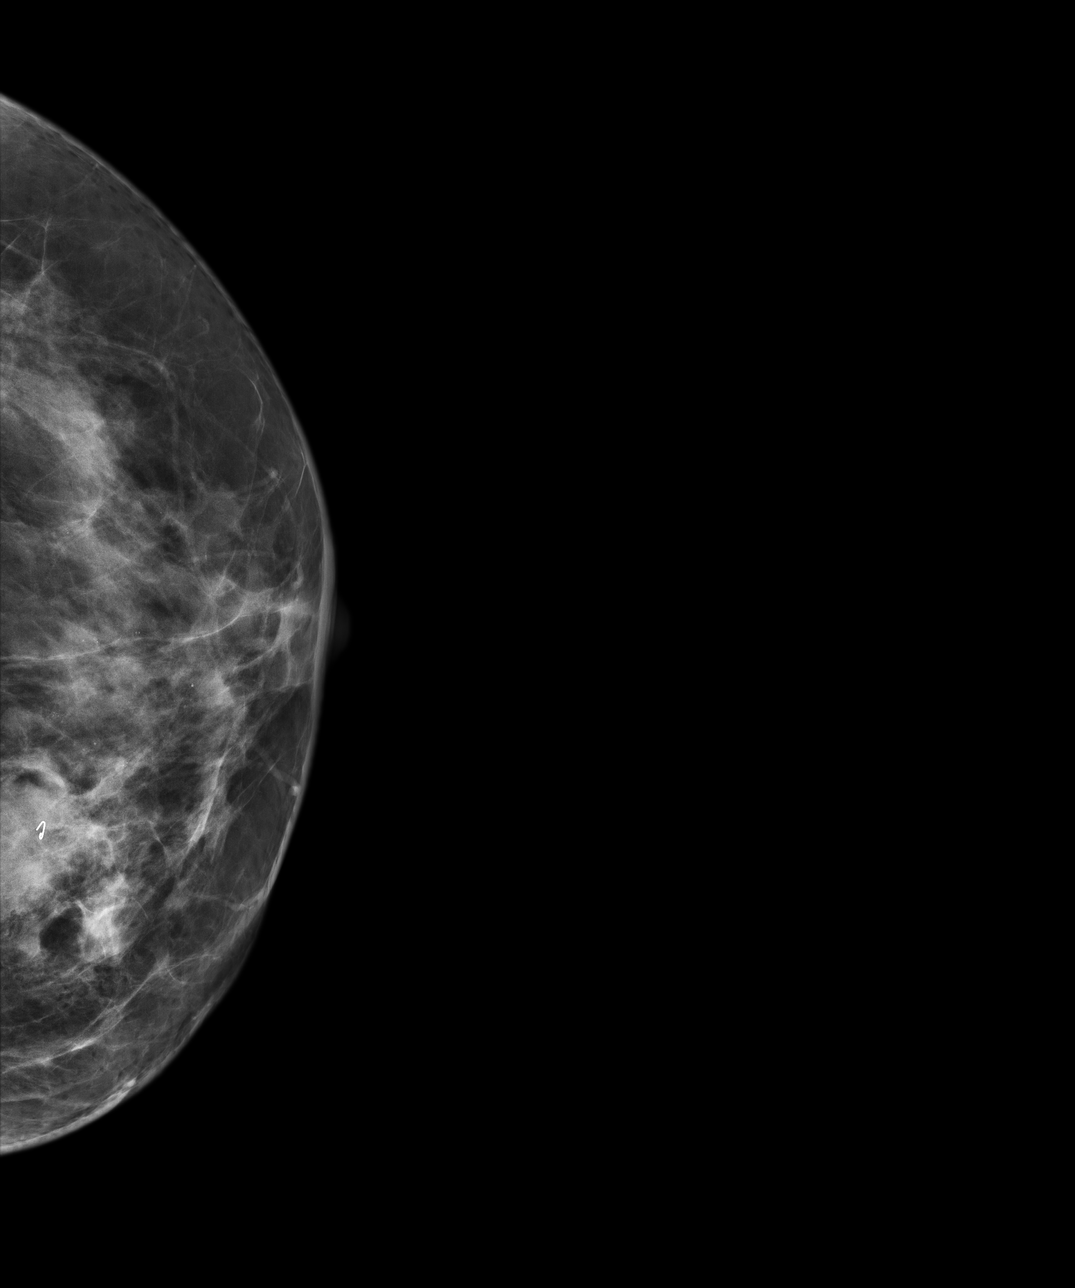
[im 4/4]
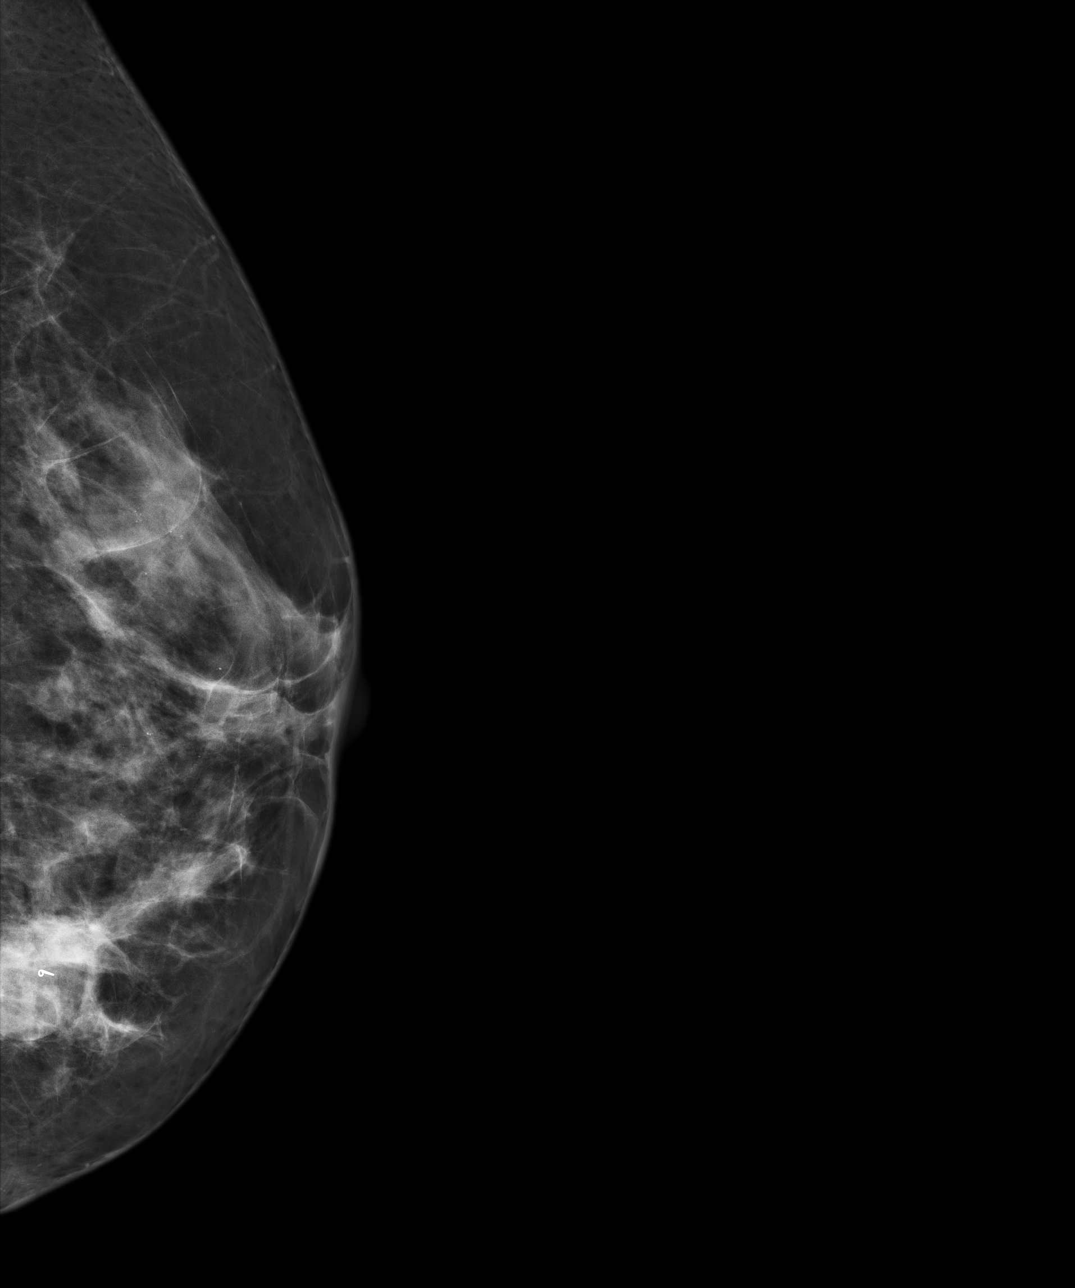

[4 of 4 positions shown; findings below may reference images not displayed]

FINDINGS: Mammographic images were obtained following ultrasound guided biopsy
of mass at left breast 7-8 o'clock. Cc and lateral view of the left
breast demonstrate a wing clip in the mass of concern.
IMPRESSION: Post biopsy mammogram demonstrating a wing shaped biopsy clip in the
mass of concern.

Final Assessment: Post Procedure Mammograms for Marker Placement

## 2016-02-25 IMAGING — MG MM BREAST NEEDLE LOCALIZATION*L*
5 series · 5 of 5 positions shown · non-contrast
Comparison: Previous exams.

CLINICAL DATA: Left breast cancer. The patient has undergone
neoadjuvant therapy. She presents 14 needle localization prior to
the lumpectomy.

EXAM:
NEEDLE LOCALIZATION OF THE LEFT BREAST WITH MAMMO GUIDANCE

[L FB (1 of 2)]
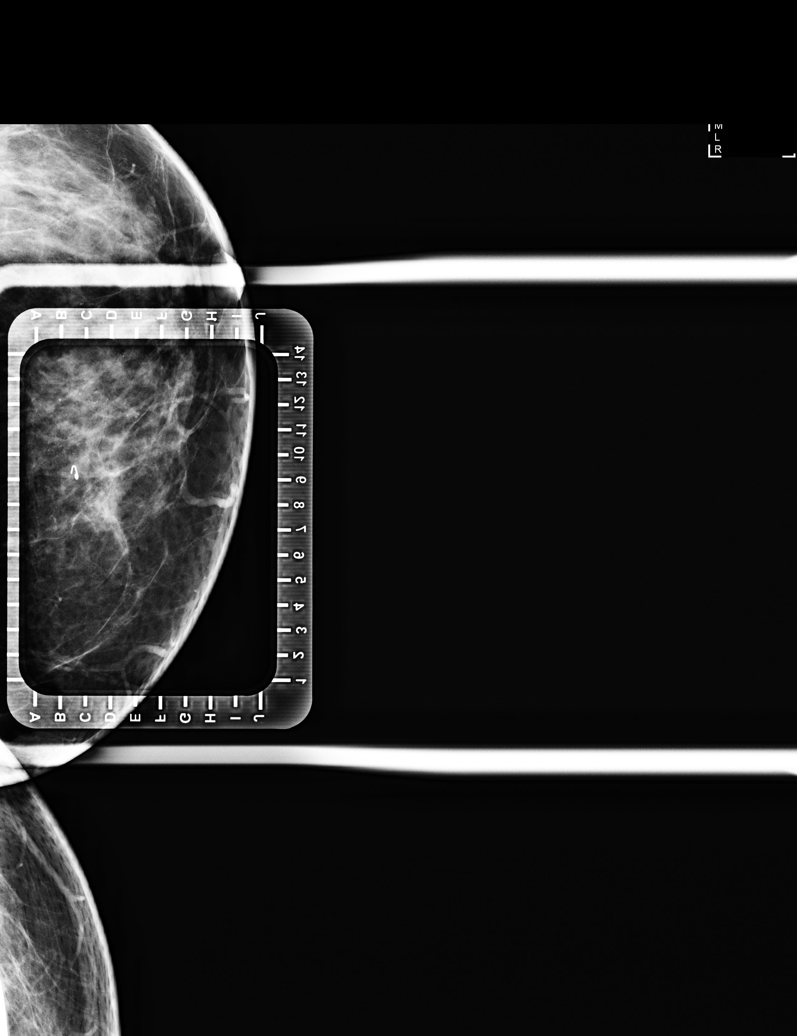

[L FB (2 of 2)]
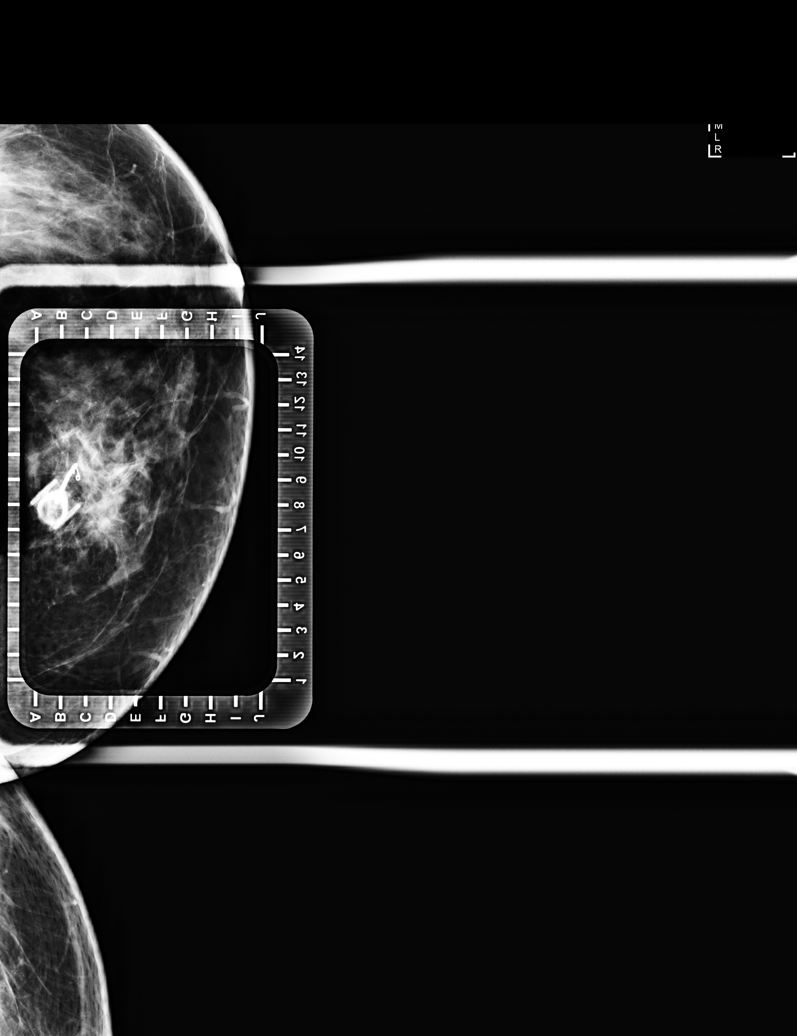

[L LM (1 of 3)]
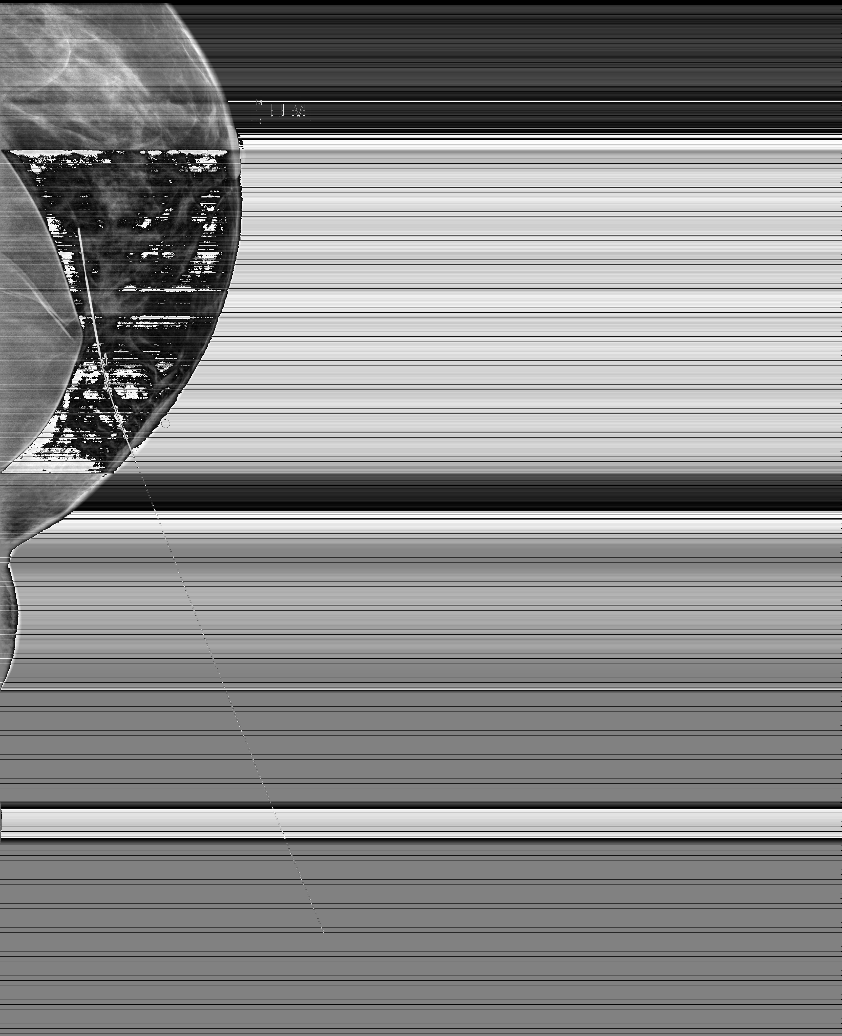

[L LM (2 of 3)]
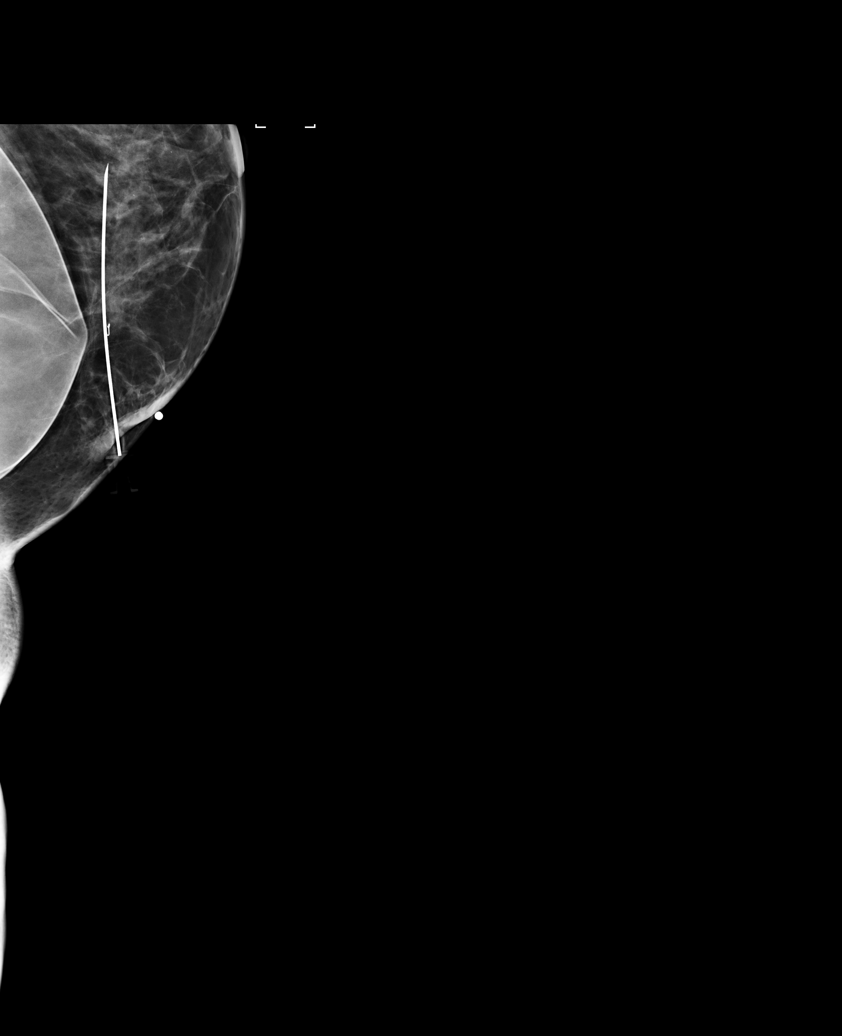

[L LM (3 of 3)]
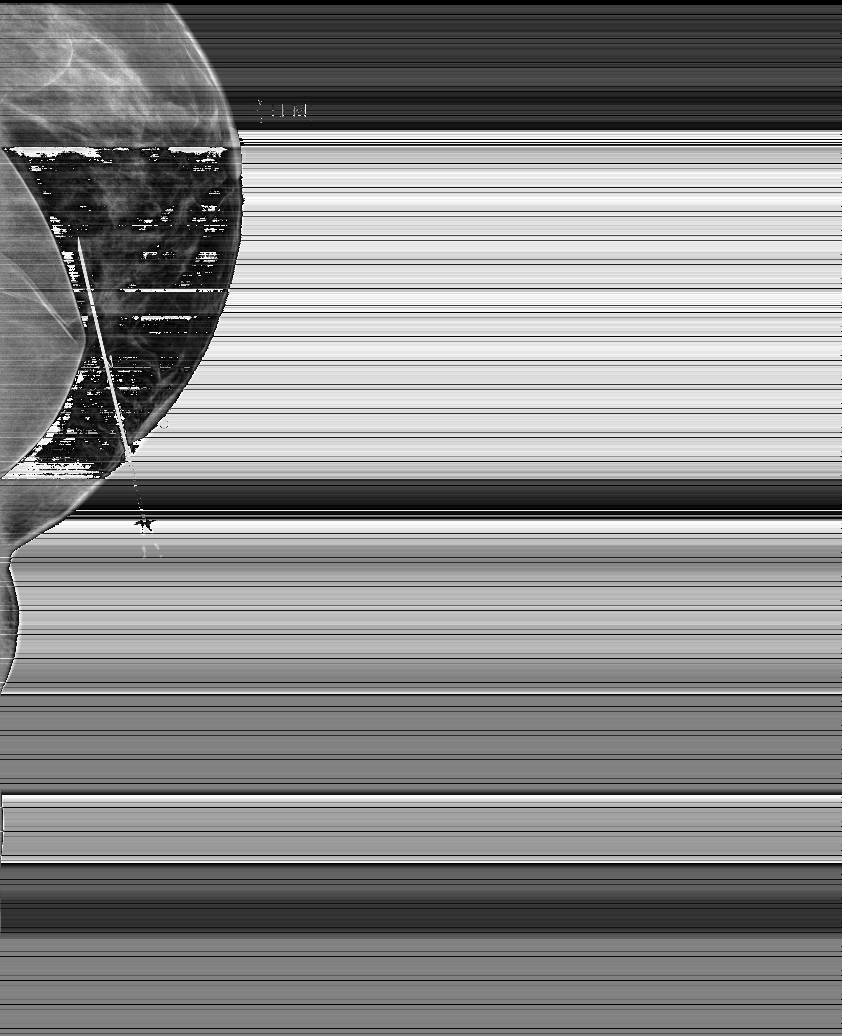

[5 of 5 positions shown; findings below may reference images not displayed]

FINDINGS: Patient presents for needle localization prior to lumpectomy. I met
with the patient and we discussed the procedure of needle
localization including benefits and alternatives. We discussed the
high likelihood of a successful procedure. We discussed the risks of
the procedure, including infection, bleeding, tissue injury, and
further surgery. Informed, written consent was given. The usual
time-out protocol was performed immediately prior to the procedure.

Using mammographic guidance, sterile technique, 2% lidocaine and a 7
cm modified Kopans needle, wing shaped biopsy clip was localized
using an inferior to superior approach. The films were available in
PACs for Dr. Kema.
IMPRESSION: Needle localization left breast. No apparent complications.

## 2016-03-12 ENCOUNTER — Other Ambulatory Visit: Payer: Self-pay | Admitting: Family Medicine

## 2016-03-12 DIAGNOSIS — F419 Anxiety disorder, unspecified: Secondary | ICD-10-CM

## 2016-03-13 NOTE — Telephone Encounter (Signed)
rx called into pharmacy

## 2016-03-20 ENCOUNTER — Telehealth: Payer: Self-pay | Admitting: Physician Assistant

## 2016-03-20 DIAGNOSIS — F419 Anxiety disorder, unspecified: Secondary | ICD-10-CM

## 2016-03-20 MED ORDER — LORAZEPAM 1 MG PO TABS: ORAL_TABLET | ORAL | 5 refills | Status: DC

## 2016-03-20 NOTE — Telephone Encounter (Signed)
Rx printed to be faxed

## 2016-03-20 NOTE — Telephone Encounter (Signed)
Noted-aa 

## 2016-03-20 NOTE — Telephone Encounter (Signed)
Pt stated that [facility] advised her the [facility] # they have for the RX LORazepam (ATIVAN) 1 MG tablet  Is coming back invalid. I advised that a new Rx was called in on 03/12/16. Pt stated they advised her they didn't have that Rx and would like a new Rx faxed or called into the pharmacy. Please advise. Thanks TNP

## 2016-03-20 NOTE — Telephone Encounter (Signed)
Spoke with pharmacist and they said that they have not received a new RX with correct information. Pharmacist did not want to take a verbal call in of this medication and wants it faxed in to them please review-aa

## 2016-05-25 ENCOUNTER — Telehealth: Payer: Self-pay

## 2016-05-25 DIAGNOSIS — M810 Age-related osteoporosis without current pathological fracture: Secondary | ICD-10-CM

## 2016-05-25 DIAGNOSIS — Z1239 Encounter for other screening for malignant neoplasm of breast: Secondary | ICD-10-CM

## 2016-05-25 DIAGNOSIS — Z853 Personal history of malignant neoplasm of breast: Secondary | ICD-10-CM

## 2016-05-25 NOTE — Telephone Encounter (Signed)
Patient called and states she needs mammogram ordered-please see last imaging, she is not seen Dr Bary Castilla or Dr Grayland Ormond, she will need a diagnostic bilateral-please see imaging. Also she wants to know if she needs to have another BMD done, last one was in 2017 and Dr Grayland Ormond change her medication to Tamoxifen but she is not following him. The reason she saw Dr Grayland Ormond for this because Dr Venia Minks was getting ready to leave this office and she was not comfortable with reading the BMD and making medication changes. Please review-aa

## 2016-05-25 NOTE — Telephone Encounter (Signed)
[id number] 

## 2016-05-25 NOTE — Telephone Encounter (Signed)
Pt advised. I advised her of what we discussed about you placing orders but if any changes need to be made she would need to see specialists again and that really it would be better if she followed them. Patient understood. She would like to have orders placed by you and she is following up with you in March and would like to go over those and then she can follow up with specialists if needed-aa

## 2016-05-25 NOTE — Telephone Encounter (Signed)
Why is she not seeing Dr. Bary Castilla or Grayland Ormond anymore? Per Dr. Dwyane Luo note she was to keep follow ups with them.  Who is filling her tamoxifen if she is not followed by them any longer? I do feel at this time she should keep her follow ups with these providers, at least with Dr. Bary Castilla.

## 2016-05-26 NOTE — Telephone Encounter (Signed)
Orders have been placed.

## 2016-07-08 ENCOUNTER — Ambulatory Visit
Admission: RE | Admit: 2016-07-08 | Discharge: 2016-07-08 | Disposition: A | Discharge status: Home / Self Care | Payer: BLUE CROSS/BLUE SHIELD | Source: Ambulatory Visit | Attending: Physician Assistant | Admitting: Physician Assistant

## 2016-07-08 ENCOUNTER — Other Ambulatory Visit: Payer: Self-pay | Admitting: Physician Assistant

## 2016-07-08 DIAGNOSIS — Z1239 Encounter for other screening for malignant neoplasm of breast: Secondary | ICD-10-CM

## 2016-07-08 DIAGNOSIS — Z853 Personal history of malignant neoplasm of breast: Secondary | ICD-10-CM

## 2016-07-08 DIAGNOSIS — Z1231 Encounter for screening mammogram for malignant neoplasm of breast: Secondary | ICD-10-CM | POA: Insufficient documentation

## 2016-07-08 DIAGNOSIS — M8588 Other specified disorders of bone density and structure, other site: Secondary | ICD-10-CM | POA: Insufficient documentation

## 2016-07-08 DIAGNOSIS — M810 Age-related osteoporosis without current pathological fracture: Secondary | ICD-10-CM

## 2016-07-16 ENCOUNTER — Ambulatory Visit (INDEPENDENT_AMBULATORY_CARE_PROVIDER_SITE_OTHER): Payer: BLUE CROSS/BLUE SHIELD | Admitting: Physician Assistant

## 2016-07-16 ENCOUNTER — Encounter: Payer: Self-pay | Admitting: Physician Assistant

## 2016-07-16 VITALS — BP 120/80 | HR 100 | Temp 98.6°F | Resp 16 | Ht 59.0 in | Wt 160.0 lb

## 2016-07-16 DIAGNOSIS — Z0000 Encounter for general adult medical examination without abnormal findings: Secondary | ICD-10-CM

## 2016-07-16 DIAGNOSIS — E7800 Pure hypercholesterolemia, unspecified: Secondary | ICD-10-CM | POA: Diagnosis not present

## 2016-07-16 DIAGNOSIS — Z79899 Other long term (current) drug therapy: Secondary | ICD-10-CM | POA: Diagnosis not present

## 2016-07-16 DIAGNOSIS — M810 Age-related osteoporosis without current pathological fracture: Secondary | ICD-10-CM

## 2016-07-16 DIAGNOSIS — R319 Hematuria, unspecified: Secondary | ICD-10-CM

## 2016-07-16 DIAGNOSIS — R609 Edema, unspecified: Secondary | ICD-10-CM

## 2016-07-16 DIAGNOSIS — G4700 Insomnia, unspecified: Secondary | ICD-10-CM | POA: Diagnosis not present

## 2016-07-16 DIAGNOSIS — R7309 Other abnormal glucose: Secondary | ICD-10-CM

## 2016-07-16 DIAGNOSIS — Z124 Encounter for screening for malignant neoplasm of cervix: Secondary | ICD-10-CM

## 2016-07-16 DIAGNOSIS — Z853 Personal history of malignant neoplasm of breast: Secondary | ICD-10-CM | POA: Diagnosis not present

## 2016-07-16 LAB — POCT URINALYSIS DIPSTICK
BILIRUBIN UA: NEGATIVE
Glucose, UA: NEGATIVE
KETONES UA: NEGATIVE
LEUKOCYTES UA: NEGATIVE
NITRITE UA: NEGATIVE
PROTEIN UA: NEGATIVE
Spec Grav, UA: 1.015
Urobilinogen, UA: 0.2
pH, UA: 6

## 2016-07-16 MED ORDER — TAMOXIFEN CITRATE 20 MG PO TABS: 20.0000 mg | ORAL_TABLET | Freq: Every day | ORAL | 3 refills | Status: DC

## 2016-07-16 MED ORDER — POTASSIUM CHLORIDE CRYS ER 10 MEQ PO TBCR: 10.0000 meq | EXTENDED_RELEASE_TABLET | Freq: Every day | ORAL | 0 refills | Status: DC | PRN

## 2016-07-16 MED ORDER — FUROSEMIDE 20 MG PO TABS: 20.0000 mg | ORAL_TABLET | Freq: Every day | ORAL | 1 refills | Status: DC

## 2016-07-16 NOTE — Progress Notes (Signed)
Patient: Dawn Rivas, Female    DOB: 07-01-1965, 51 y.o.   MRN: [id number] Visit Date: 07/16/2016  Today's Provider: Mar Daring, PA-C   Chief Complaint  Patient presents with  . Annual Exam   Subjective:    Annual physical exam Dawn Rivas is a 51 y.o. female who presents today for health maintenance and complete physical. She feels well. She reports exercising daily. She reports she is sleeping well. 07/16/15 CPE 06/23/13 Pap-neg; HPV-neg 07/08/16 Mammogram-BI-RADS 2 07/08/16 BMD-Osteoporosis 08/01/14 Colonoscopy-normal ----------------------------------------------------------------- Patient concerned about weight gain, pt reports it may be related to tamoxifen. Patient is very active and strict with her diet.    Review of Systems  Constitutional: Positive for diaphoresis and unexpected weight change.  HENT: Negative.   Eyes: Negative.   Respiratory: Negative.   Cardiovascular: Negative.   Gastrointestinal: Negative.   Endocrine: Negative.   Genitourinary: Negative.   Musculoskeletal: Negative.   Skin: Negative.   Allergic/Immunologic: Negative.   Neurological: Negative.   Hematological: Negative.   Psychiatric/Behavioral: Negative.     Social History      She  reports that she has never smoked. She has never used smokeless tobacco. She reports that she drinks alcohol. She reports that she does not use drugs.       Social History   Social History  . Marital status: Married    Spouse name: N/A  . Number of children: 3  . Years of education: N/A   Occupational History  .  [employer]   Social History Main Topics  . Smoking status: Never Smoker  . Smokeless tobacco: Never Used  . Alcohol use Yes  . Drug use: No  . Sexual activity: Not Asked   Other Topics Concern  . None   Social History Narrative  . None    Past Medical History:  Diagnosis Date  . Breast cancer ([place]) 2015   left  . Cancer ([place]) 2015   Left breast, 2  microscopic foci residual tumor: 3.5 mm and one 0.0 mm respectively. Sentinel nodes negative x3. Closest margin 4 mm. Definitive affective neoadjuvant chemotherapy identified both of the breast sentinel lymph node. YyPT1a, N0.  ER 5%, PR 5%, HER-2/neu is not amplified.     Patient Active Problem List   Diagnosis Date Noted  . Hematuria 07/16/2015  . Abnormal blood sugar 07/16/2015  . High risk medication use 07/16/2015  . Osteoporosis 07/16/2015  . GERD (gastroesophageal reflux disease) 07/16/2015  . Absence of menstruation 06/19/2015  . Adult BMI 30+ 06/19/2015  . Breast carcinoma ([place]) 06/19/2015  . Cold sore 06/19/2015  . Calcium blood increased 06/19/2015  . Cannot sleep 06/19/2015  . Irregular bleeding 06/19/2015  . Hypercholesterolemia without hypertriglyceridemia 06/19/2015  . Allergic rhinitis, seasonal 06/19/2015  . Anxiety 02/18/2015  . Encounter for screening colonoscopy 07/10/2014  . Breast cancer ([place]) 07/17/2013    Past Surgical History:  Procedure Laterality Date  . AUGMENTATION MAMMAPLASTY Bilateral    breast implants  . BREAST BIOPSY Left 2015  . BREAST ENHANCEMENT SURGERY  7 years ago  . BREAST LUMPECTOMY Left 01/05/2014   Wide excision, mastoplasty, sentinel node biopsy.  Marland Kitchen PORTACATH PLACEMENT  07/24/13  . TUBAL LIGATION  1997  . UPPER GI ENDOSCOPY  09/28/13   hiatus hernia, la grade a reflux esophagitis, normal stomach, normal duodenum    Family History        Family Status  Relation Status  . Mother Alive  .  Maternal Grandfather Deceased at age 42  . Father Alive  . Sister Alive  . Brother Alive  . Son Alive  . Maternal Grandmother Alive  . Paternal Grandmother Deceased at age 71  . Paternal Grandfather Deceased at age 57  . Son Alive  . Son Alive  . Maternal Aunt         Her family history includes Alzheimer's disease in her paternal grandmother; Breast cancer in her maternal aunt; Cervical cancer in her mother; Heart attack in her father  and paternal grandfather; Heart disease in her father; Lung cancer in her maternal grandfather; Stroke in her paternal grandmother; Uterine cancer in her mother.     Allergies  Allergen Reactions  . Penicillins Hives  . Sulfur Hives     Current Outpatient Prescriptions:  .  LORazepam (ATIVAN) 1 MG tablet, TAKE 1/2 TO 1 TABLET BY MOUTH AS NEEDED FOR INSOMNIA, Disp: 30 tablet, Rfl: 5 .  omeprazole (PRILOSEC) 20 MG capsule, Take 1 capsule by mouth daily., Disp: , Rfl:  .  tamoxifen (NOLVADEX) 20 MG tablet, Take 1 tablet (20 mg total) by mouth daily., Disp: 90 tablet, Rfl: 3 .  valACYclovir (VALTREX) 1000 MG tablet, Take 2 tablets (2,000 mg total) by mouth 2 (two) times daily. (Patient taking differently: Take 2,000 mg by mouth 2 (two) times daily. As needed), Disp: 16 tablet, Rfl: 3   Patient Care Team: Mar Daring, PA-C as PCP - General (Family Medicine) Robert Bellow, MD (General Surgery) Margarita Rana, MD as Referring Physician (Family Medicine) Kennieth Francois, MD (Inactive) as Consulting Physician (Hematology and Oncology) Lloyd Huger, MD as Consulting Physician (Oncology)      Objective:   Vitals: BP 120/80 (BP Location: Right Arm, Patient Position: Sitting, Cuff Size: Large)   Pulse 100   Temp 98.6 F (37 C) (Oral)   Resp 16   Ht '4\' 11"'  (1.499 m)   Wt 160 lb (72.6 kg)   BMI 32.32 kg/m    Vitals:   07/16/16 0908  BP: 120/80  Pulse: 100  Resp: 16  Temp: 98.6 F (37 C)  TempSrc: Oral  Weight: 160 lb (72.6 kg)  Height: '4\' 11"'  (1.499 m)     Physical Exam  Constitutional: She is oriented to person, place, and time. She appears well-developed and well-nourished. No distress.  HENT:  Head: Normocephalic and atraumatic.  Right Ear: Hearing, tympanic membrane, external ear and ear canal normal.  Left Ear: Hearing, tympanic membrane, external ear and ear canal normal.  Nose: Nose normal.  Mouth/Throat: Uvula is midline, oropharynx is clear and moist  and mucous membranes are normal. No oropharyngeal exudate.  Eyes: Conjunctivae and EOM are normal. Pupils are equal, round, and reactive to light. Right eye exhibits no discharge. Left eye exhibits no discharge. No scleral icterus.  Neck: Normal range of motion. Neck supple. No JVD present. Carotid bruit is not present. No tracheal deviation present. No thyromegaly present.  Cardiovascular: Normal rate, regular rhythm, normal heart sounds and intact distal pulses.  Exam reveals no gallop and no friction rub.   No murmur heard. Pulmonary/Chest: Effort normal and breath sounds normal. No respiratory distress. She has no wheezes. She has no rales. She exhibits no tenderness. Right breast exhibits no inverted nipple, no mass, no nipple discharge, no skin change and no tenderness. Left breast exhibits no inverted nipple, no mass, no nipple discharge, no skin change and no tenderness. Breasts are symmetrical.  Abdominal: Soft. Bowel sounds are normal. She  exhibits no distension and no mass. There is no tenderness. There is no rebound and no guarding. Hernia confirmed negative in the right inguinal area and confirmed negative in the left inguinal area.  Genitourinary: Rectum normal, vagina normal and uterus normal. No breast swelling, tenderness, discharge or bleeding. Pelvic exam was performed with patient supine. There is no rash, tenderness, lesion or injury on the right labia. There is no rash, tenderness, lesion or injury on the left labia. Cervix exhibits no motion tenderness, no discharge and no friability. Right adnexum displays no mass, no tenderness and no fullness. Left adnexum displays no mass, no tenderness and no fullness. No erythema, tenderness or bleeding in the vagina. No signs of injury around the vagina. No vaginal discharge found.  Musculoskeletal: Normal range of motion. She exhibits no edema or tenderness.  Lymphadenopathy:    She has no cervical adenopathy.       Right: No inguinal  adenopathy present.       Left: No inguinal adenopathy present.  Neurological: She is alert and oriented to person, place, and time. She has normal reflexes. No cranial nerve deficit. Coordination normal.  Skin: Skin is warm and dry. No rash noted. She is not diaphoretic.  Psychiatric: She has a normal mood and affect. Her behavior is normal. Judgment and thought content normal.  Vitals reviewed.    Depression Screen PHQ 2/9 Scores 07/16/2016  PHQ - 2 Score 0  PHQ- 9 Score 0   GAD 7 : Generalized Anxiety Score 07/16/2016  Nervous, Anxious, on Edge 0  Control/stop worrying 0  Worry too much - different things 0  Trouble relaxing 0  Restless 0  Easily annoyed or irritable 0  Afraid - awful might happen 0  Total GAD 7 Score 0  Anxiety Difficulty Not difficult at all     Assessment & Plan:     Routine Health Maintenance and Physical Exam  Exercise Activities and Dietary recommendations Goals    None      Immunization History  Administered Date(s) Administered  . Tdap 01/07/2012    Health Maintenance  Topic Date Due  . INFLUENZA VACCINE  12/10/2015  . PAP SMEAR  06/23/2016  . MAMMOGRAM  07/08/2018  . TETANUS/TDAP  01/06/2022  . COLONOSCOPY  07/31/2024  . Hepatitis C Screening  Completed  . HIV Screening  Completed     Discussed health benefits of physical activity, and encouraged her to engage in regular exercise appropriate for her age and condition.   1. Annual physical exam UA normal today. Normal physical exam today. Will check labs as below and f/u pending lab results. If labs are stable and WNL she will not need to have these rechecked for one year at her next annual physical exam. She is to call the office in the meantime if she has any acute issue, questions or concerns. - POCT urinalysis dipstick  2. Cervical cancer screening Pap collected today. Will send as below and f/u pending results. - Pap IG and HPV (high risk) DNA detection  3.  Hypercholesterolemia without hypertriglyceridemia Stable. Diet controlled. Will check labs as below and f/u pending results. - CBC with Differential/Platelet - Lipid Panel With LDL/HDL Ratio  4. Abnormal blood sugar Will check labs as below and f/u pending results. - Hemoglobin A1c  5. High risk medication use Tamoxifen for breast cancer history. She is having weight gain with tamoxifen. She is on year 3 of anti-estrogen medications. She started with letrozole during 1st year and had  adverse effects (osteoporosis) so she was switched to tamoxifen and has had weight gain despite working out regularly and monitoring her diet. She follows up with Dr. Bary Castilla next week.   6. Hematuria, unspecified type None on UA today.   7. Calcium blood increased Will check labs as below and f/u pending results. - Comprehensive metabolic panel  8. Insomnia, unspecified type Most likely secondary to hot flashes from tamoxifen. Will check labs as below and f/u pending results. - TSH  9. Osteoporosis, unspecified osteoporosis type, unspecified pathological fracture presence Secondary to letrozle. Will check labs as below and f/u pending results. BMD last done on 07/08/16 and showed improvement since discontinuing letrozole and starting tamoxifen.  - VITAMIN D 25 Hydroxy (Vit-D Deficiency, Fractures)  10. Edema, unspecified type Stable. Diagnosis pulled for medication refill. Continue current medical treatment plan. - furosemide (LASIX) 20 MG tablet; Take 1 tablet (20 mg total) by mouth daily.  Dispense: 90 tablet; Refill: 1 - potassium chloride (K-DUR,KLOR-CON) 10 MEQ tablet; Take 1 tablet (10 mEq total) by mouth daily as needed. When you take furosemide  Dispense: 90 tablet; Refill: 0  11. History of cancer of left breast See above issues with letrozole and tamoxifen. Sees Dr. Bary Castilla next week. - tamoxifen (NOLVADEX) 20 MG tablet; Take 1 tablet (20 mg total) by mouth daily.  Dispense: 90 tablet; Refill:  3  --------------------------------------------------------------------    Mar Daring, PA-C  [facility]

## 2016-07-16 NOTE — Patient Instructions (Signed)
Health Maintenance, Female Adopting a healthy lifestyle and getting preventive care can go a long way to promote health and wellness. Talk with your health care provider about what schedule of regular examinations is right for you. This is a good chance for you to check in with your provider about disease prevention and staying healthy. In between checkups, there are plenty of things you can do on your own. Experts have done a lot of research about which lifestyle changes and preventive measures are most likely to keep you healthy. Ask your health care provider for more information. Weight and diet Eat a healthy diet  Be sure to include plenty of vegetables, fruits, low-fat dairy products, and lean protein.  Do not eat a lot of foods high in solid fats, added sugars, or salt.  Get regular exercise. This is one of the most important things you can do for your health.  Most adults should exercise for at least 150 minutes each week. The exercise should increase your heart rate and make you sweat (moderate-intensity exercise).  Most adults should also do strengthening exercises at least twice a week. This is in addition to the moderate-intensity exercise. Maintain a healthy weight  Body mass index (BMI) is a measurement that can be used to identify possible weight problems. It estimates body fat based on height and weight. Your health care provider can help determine your BMI and help you achieve or maintain a healthy weight.  For females 53 years of age and older:  A BMI below 18.5 is considered underweight.  A BMI of 18.5 to 24.9 is normal.  A BMI of 25 to 29.9 is considered overweight.  A BMI of 30 and above is considered obese. Watch levels of cholesterol and blood lipids  You should start having your blood tested for lipids and cholesterol at 53 years of age, then have this test every 5 years.  You may need to have your cholesterol levels checked more often if:  Your lipid or  cholesterol levels are high.  You are older than 53 years of age.  You are at high risk for heart disease. Cancer screening Lung Cancer  Lung cancer screening is recommended for adults 53-42 years old who are at high risk for lung cancer because of a history of smoking.  A yearly low-dose CT scan of the lungs is recommended for people who: 53  Currently smoke.  Have quit within the past 15 years.  Have at least a 30-pack-year history of smoking. A pack year is smoking an average of one pack of cigarettes a day for 1 year.  Yearly screening should continue until it has been 15 years since you quit.  Yearly screening should stop if you develop a health problem that would prevent you from having lung cancer treatment. Breast Cancer  Practice breast self-awareness. This means understanding how your breasts normally appear and feel.  It also means doing regular breast self-exams. Let your health care provider know about any changes, no matter how small.  If you are in your 53s or 30s, you should have a clinical breast exam (CBE) by a health care provider every 1-3 years as part of a regular health exam.  If you are 53 or older, have a CBE every year. Also consider having a breast X-ray (mammogram) every year.  If you have a family history of breast cancer, talk to your health care provider about genetic screening.  If you are at high risk for breast cancer, talk  to your health care provider about having an MRI and a mammogram every year.  Breast cancer gene (BRCA) assessment is recommended for women who have family members with BRCA-related cancers. BRCA-related cancers include:  Breast.  Ovarian.  Tubal.  Peritoneal cancers.  Results of the assessment will determine the need for genetic counseling and BRCA1 and BRCA2 testing. Cervical Cancer  Your health care provider may recommend that you be screened regularly for cancer of the pelvic organs (ovaries, uterus, and vagina).  This screening involves a pelvic examination, including checking for microscopic changes to the surface of your cervix (Pap test). You may be encouraged to have this screening done every 3 years, beginning at age 53.  For women ages 53-65, health care providers may recommend pelvic exams and Pap testing every 3 years, or they may recommend the Pap and pelvic exam, combined with testing for human papilloma virus (HPV), every 5 years. Some types of HPV increase your risk of cervical cancer. Testing for HPV may also be done on women of any age with unclear Pap test results.  Other health care providers may not recommend any screening for nonpregnant women who are considered low risk for pelvic cancer and who do not have symptoms. Ask your health care provider if a screening pelvic exam is right for you.  If you have had past treatment for cervical cancer or a condition that could lead to cancer, you need Pap tests and screening for cancer for at least 20 years after your treatment. If Pap tests have been discontinued, your risk factors (such as having a new sexual partner) need to be reassessed to determine if screening should resume. Some women have medical problems that increase the chance of getting cervical cancer. In these cases, your health care provider may recommend more frequent screening and Pap tests. Colorectal Cancer  This type of cancer can be detected and often prevented.  Routine colorectal cancer screening usually begins at 53 years of age and continues through 53 years of age.  Your health care provider may recommend screening at an earlier age if you have risk factors for colon cancer.  Your health care provider may also recommend using home test kits to check for hidden blood in the stool.  A small camera at the end of a tube can be used to examine your colon directly (sigmoidoscopy or colonoscopy). This is done to check for the earliest forms of colorectal cancer.  Routine  screening usually begins at age 53.  Direct examination of the colon should be repeated every 5-10 years through 53 years of age. However, you may need to be screened more often if early forms of precancerous polyps or small growths are found. Skin Cancer  Check your skin from head to toe regularly.  Tell your health care provider about any new moles or changes in moles, especially if there is a change in a mole's shape or color.  Also tell your health care provider if you have a mole that is larger than the size of a pencil eraser.  Always use sunscreen. Apply sunscreen liberally and repeatedly throughout the day.  Protect yourself by wearing long sleeves, pants, a wide-brimmed hat, and sunglasses whenever you are outside. Heart disease, diabetes, and high blood pressure  High blood pressure causes heart disease and increases the risk of stroke. High blood pressure is more likely to develop in:  People who have blood pressure in the high end of the normal range (130-139/85-89 mm Hg).  People who are overweight or obese.  People who are African American.  If you are 59-24 years of age, have your blood pressure checked every 3-5 years. If you are 34 years of age or older, have your blood pressure checked every year. You should have your blood pressure measured twice-once when you are at a hospital or clinic, and once when you are not at a hospital or clinic. Record the average of the two measurements. To check your blood pressure when you are not at a hospital or clinic, you can use:  An automated blood pressure machine at a pharmacy.  A home blood pressure monitor.  If you are between 29 years and 60 years old, ask your health care provider if you should take aspirin to prevent strokes.  Have regular diabetes screenings. This involves taking a blood sample to check your fasting blood sugar level.  If you are at a normal weight and have a low risk for diabetes, have this test once  every three years after 53 years of age.  If you are overweight and have a high risk for diabetes, consider being tested at a younger age or more often. Preventing infection Hepatitis B  If you have a higher risk for hepatitis B, you should be screened for this virus. You are considered at high risk for hepatitis B if:  You were born in a country where hepatitis B is common. Ask your health care provider which countries are considered high risk.  Your parents were born in a high-risk country, and you have not been immunized against hepatitis B (hepatitis B vaccine).  You have HIV or AIDS.  You use needles to inject street drugs.  You live with someone who has hepatitis B.  You have had sex with someone who has hepatitis B.  You get hemodialysis treatment.  You take certain medicines for conditions, including cancer, organ transplantation, and autoimmune conditions. Hepatitis C  Blood testing is recommended for:  Everyone born from [birth year] through 1967.  Anyone with known risk factors for hepatitis C. Sexually transmitted infections (STIs)  You should be screened for sexually transmitted infections (STIs) including gonorrhea and chlamydia if:  You are sexually active and are younger than 53 years of age.  You are older than 53 years of age and your health care provider tells you that you are at risk for this type of infection.  Your sexual activity has changed since you were last screened and you are at an increased risk for chlamydia or gonorrhea. Ask your health care provider if you are at risk.  If you do not have HIV, but are at risk, it may be recommended that you take a prescription medicine daily to prevent HIV infection. This is called pre-exposure prophylaxis (PrEP). You are considered at risk if:  You are sexually active and do not regularly use condoms or know the HIV status of your partner(s).  You take drugs by injection.  You are sexually active with a partner  who has HIV. Talk with your health care provider about whether you are at high risk of being infected with HIV. If you choose to begin PrEP, you should first be tested for HIV. You should then be tested every 3 months for as long as you are taking PrEP. Pregnancy  If you are premenopausal and you may become pregnant, ask your health care provider about preconception counseling.  If you may become pregnant, take 400 to 800 micrograms (mcg) of folic acid  every day.  If you want to prevent pregnancy, talk to your health care provider about birth control (contraception). Osteoporosis and menopause  Osteoporosis is a disease in which the bones lose minerals and strength with aging. This can result in serious bone fractures. Your risk for osteoporosis can be identified using a bone density scan.  If you are 50 years of age or older, or if you are at risk for osteoporosis and fractures, ask your health care provider if you should be screened.  Ask your health care provider whether you should take a calcium or vitamin D supplement to lower your risk for osteoporosis.  Menopause may have certain physical symptoms and risks.  Hormone replacement therapy may reduce some of these symptoms and risks. Talk to your health care provider about whether hormone replacement therapy is right for you. Follow these instructions at home:  Schedule regular health, dental, and eye exams.  Stay current with your immunizations.  Do not use any tobacco products including cigarettes, chewing tobacco, or electronic cigarettes.  If you are pregnant, do not drink alcohol.  If you are breastfeeding, limit how much and how often you drink alcohol.  Limit alcohol intake to no more than 1 drink per day for nonpregnant women. One drink equals 12 ounces of beer, 5 ounces of wine, or 1 ounces of hard liquor.  Do not use street drugs.  Do not share needles.  Ask your health care provider for help if you need support  or information about quitting drugs.  Tell your health care provider if you often feel depressed.  Tell your health care provider if you have ever been abused or do not feel safe at home. This information is not intended to replace advice given to you by your health care provider. Make sure you discuss any questions you have with your health care provider. Document Released: 11/10/2010 Document Revised: 10/03/2015 Document Reviewed: 01/29/2015 Elsevier Interactive Patient Education  2017 [employer]. Furosemide tablets What is this medicine? FUROSEMIDE (fyoor [place] se mide) is a diuretic. It helps you make more urine and to lose salt and excess water from your body. This medicine is used to treat high blood pressure, and edema or swelling from heart, kidney, or liver disease. This medicine may be used for other purposes; ask your health care provider or pharmacist if you have questions. COMMON BRAND NAME(S): Active-Medicated Specimen Kit, Delone, Diuscreen, Lasix, RX Specimen Collection Kit, Specimen Collection Kit, URINX Medicated Specimen Collection What should I tell my health care provider before I take this medicine? They need to know if you have any of these conditions: -abnormal blood electrolytes -diarrhea or vomiting -gout -heart disease -kidney disease, small amounts of urine, or difficulty passing urine -liver disease -thyroid disease -an unusual or allergic reaction to furosemide, sulfa drugs, other medicines, foods, dyes, or preservatives -pregnant or trying to get pregnant -breast-feeding How should I use this medicine? Take this medicine by mouth with a glass of water. Follow the directions on the prescription label. You may take this medicine with or without food. If it upsets your stomach, take it with food or milk. Do not take your medicine more often than directed. Remember that you will need to pass more urine after taking this medicine. Do not take your medicine at a  time of day that will cause you problems. Do not take at bedtime. Talk to your pediatrician regarding the use of this medicine in children. While this drug may be prescribed for selected  conditions, precautions do apply. Overdosage: If you think you have taken too much of this medicine contact a poison control center or emergency room at once. NOTE: This medicine is only for you. Do not share this medicine with others. What if I miss a dose? If you miss a dose, take it as soon as you can. If it is almost time for your next dose, take only that dose. Do not take double or extra doses. What may interact with this medicine? -aspirin and aspirin-like medicines -certain antibiotics -chloral hydrate -cisplatin -cyclosporine -digoxin -diuretics -laxatives -lithium -medicines for blood pressure -medicines that relax muscles for surgery -methotrexate -NSAIDs, medicines for pain and inflammation like ibuprofen, naproxen, or indomethacin -phenytoin -steroid medicines like prednisone or cortisone -sucralfate -thyroid hormones This list may not describe all possible interactions. Give your health care provider a list of all the medicines, herbs, non-prescription drugs, or dietary supplements you use. Also tell them if you smoke, drink alcohol, or use illegal drugs. Some items may interact with your medicine. What should I watch for while using this medicine? Visit your doctor or health care professional for regular checks on your progress. Check your blood pressure regularly. Ask your doctor or health care professional what your blood pressure should be, and when you should contact him or her. If you are a diabetic, check your blood sugar as directed. You may need to be on a special diet while taking this medicine. Check with your doctor. Also, ask how many glasses of fluid you need to drink a day. You must not get dehydrated. You may get drowsy or dizzy. Do not drive, use machinery, or do anything  that needs mental alertness until you know how this drug affects you. Do not stand or sit up quickly, especially if you are an older patient. This reduces the risk of dizzy or fainting spells. Alcohol can make you more drowsy and dizzy. Avoid alcoholic drinks. This medicine can make you more sensitive to the sun. Keep out of the sun. If you cannot avoid being in the sun, wear protective clothing and use sunscreen. Do not use sun lamps or tanning beds/booths. What side effects may I notice from receiving this medicine? Side effects that you should report to your doctor or health care professional as soon as possible: -blood in urine or stools -dry mouth -fever or chills -hearing loss or ringing in the ears -irregular heartbeat -muscle pain or weakness, cramps -skin rash -stomach upset, pain, or nausea -tingling or numbness in the hands or feet -unusually weak or tired -vomiting or diarrhea -yellowing of the eyes or skin Side effects that usually do not require medical attention (report to your doctor or health care professional if they continue or are bothersome): -headache -loss of appetite -unusual bleeding or bruising This list may not describe all possible side effects. Call your doctor for medical advice about side effects. You may report side effects to FDA at 1-800-FDA-1088. Where should I keep my medicine? Keep out of the reach of children. Store at room temperature between 15 and 30 degrees C (59 and 86 degrees F). Protect from light. Throw away any unused medicine after the expiration date. NOTE: This sheet is a summary. It may not cover all possible information. If you have questions about this medicine, talk to your doctor, pharmacist, or health care provider.  2018 Elsevier/Gold Standard (2014-07-18 13:49:50)

## 2016-07-17 ENCOUNTER — Telehealth: Payer: Self-pay | Admitting: Emergency Medicine

## 2016-07-17 LAB — COMPREHENSIVE METABOLIC PANEL
ALBUMIN: 4.6 g/dL (ref 3.5–5.5)
ALK PHOS: 33 IU/L — AB (ref 39–117)
ALT: 37 IU/L — ABNORMAL HIGH (ref 0–32)
AST: 30 IU/L (ref 0–40)
Albumin/Globulin Ratio: 1.8 (ref 1.2–2.2)
BILIRUBIN TOTAL: 0.4 mg/dL (ref 0.0–1.2)
BUN / CREAT RATIO: 17 (ref 9–23)
BUN: 15 mg/dL (ref 6–24)
CHLORIDE: 101 mmol/L (ref 96–106)
CO2: 25 mmol/L (ref 18–29)
Calcium: 9.4 mg/dL (ref 8.7–10.2)
Creatinine, Ser: 0.86 mg/dL (ref 0.57–1.00)
GFR calc Af Amer: 90 mL/min/{1.73_m2} (ref >59)
GFR calc non Af Amer: 78 mL/min/{1.73_m2} (ref >59)
GLUCOSE: 90 mg/dL (ref 65–99)
Globulin, Total: 2.6 g/dL (ref 1.5–4.5)
POTASSIUM: 4.7 mmol/L (ref 3.5–5.2)
Sodium: 141 mmol/L (ref 134–144)
Total Protein: 7.2 g/dL (ref 6.0–8.5)

## 2016-07-17 LAB — HEMOGLOBIN A1C
Est. average glucose Bld gHb Est-mCnc: 100 mg/dL
HEMOGLOBIN A1C: 5.1 % (ref 4.8–5.6)

## 2016-07-17 LAB — CBC WITH DIFFERENTIAL/PLATELET
BASOS ABS: 0 10*3/uL (ref 0.0–0.2)
Basos: 0 %
EOS (ABSOLUTE): 0.1 10*3/uL (ref 0.0–0.4)
Eos: 3 %
Hematocrit: 41.2 % (ref 34.0–46.6)
Hemoglobin: 14.1 g/dL (ref 11.1–15.9)
Immature Grans (Abs): 0 10*3/uL (ref 0.0–0.1)
Immature Granulocytes: 0 %
Lymphocytes Absolute: 2.1 10*3/uL (ref 0.7–3.1)
Lymphs: 42 %
MCH: 32.8 pg (ref 26.6–33.0)
MCHC: 34.2 g/dL (ref 31.5–35.7)
MCV: 96 fL (ref 79–97)
Monocytes Absolute: 0.4 10*3/uL (ref 0.1–0.9)
Monocytes: 7 %
NEUTROS ABS: 2.5 10*3/uL (ref 1.4–7.0)
Neutrophils: 48 %
Platelets: 197 10*3/uL (ref 150–379)
RBC: 4.3 x10E6/uL (ref 3.77–5.28)
RDW: 13.5 % (ref 12.3–15.4)
WBC: 5.1 10*3/uL (ref 3.4–10.8)

## 2016-07-17 LAB — LIPID PANEL WITH LDL/HDL RATIO
Cholesterol, Total: 197 mg/dL (ref 100–199)
HDL: 50 mg/dL (ref >39)
LDL Calculated: 123 mg/dL — ABNORMAL HIGH (ref 0–99)
LDL/HDL RATIO: 2.5 ratio (ref 0.0–3.2)
TRIGLYCERIDES: 121 mg/dL (ref 0–149)
VLDL Cholesterol Cal: 24 mg/dL (ref 5–40)

## 2016-07-17 LAB — VITAMIN D 25 HYDROXY (VIT D DEFICIENCY, FRACTURES): VIT D 25 HYDROXY: 36.2 ng/mL (ref 30.0–100.0)

## 2016-07-17 LAB — TSH: TSH: 1.93 u[IU]/mL (ref 0.450–4.500)

## 2016-07-17 NOTE — Telephone Encounter (Signed)
Pt informed and voiced understanding of results. 

## 2016-07-17 NOTE — Telephone Encounter (Signed)
-----   Message from Mar Daring, PA-C sent at 07/17/2016  8:11 AM EST ----- All labs are fairly stable and WNL. Cholesterol has improved since last year.

## 2016-07-19 LAB — PAP IG AND HPV HIGH-RISK
HPV, HIGH-RISK: NEGATIVE
PAP SMEAR COMMENT: 0

## 2016-07-20 ENCOUNTER — Telehealth: Payer: Self-pay

## 2016-07-20 ENCOUNTER — Ambulatory Visit (INDEPENDENT_AMBULATORY_CARE_PROVIDER_SITE_OTHER): Payer: BLUE CROSS/BLUE SHIELD | Admitting: General Surgery

## 2016-07-20 ENCOUNTER — Encounter: Payer: Self-pay | Admitting: General Surgery

## 2016-07-20 VITALS — BP 124/74 | HR 84 | Resp 12 | Ht 62.0 in | Wt 159.0 lb

## 2016-07-20 DIAGNOSIS — C50512 Malignant neoplasm of lower-outer quadrant of left female breast: Secondary | ICD-10-CM | POA: Diagnosis not present

## 2016-07-20 DIAGNOSIS — Z170 Estrogen receptor positive status [ER+]: Secondary | ICD-10-CM

## 2016-07-20 MED ORDER — LETROZOLE 2.5 MG PO TABS: 2.5000 mg | ORAL_TABLET | Freq: Every day | ORAL | 3 refills | Status: DC

## 2016-07-20 MED ORDER — IBANDRONATE SODIUM 150 MG PO TABS: 150.0000 mg | ORAL_TABLET | ORAL | 3 refills | Status: DC

## 2016-07-20 NOTE — Patient Instructions (Addendum)
Patient to stop Tamoxifen and start taking letrozole daily and Boniva once a month. Return in one year.

## 2016-07-20 NOTE — Telephone Encounter (Signed)
Pt advised.   Thanks,   -Odessa Nishi  

## 2016-07-20 NOTE — Telephone Encounter (Signed)
LMTCB 07/26/2016  Thanks,   -Mickel Baas

## 2016-07-20 NOTE — Progress Notes (Signed)
Patient ID: Dawn Rivas, female   DOB: [birth year], 51 y.o.   MRN: [id number]  Chief Complaint  Patient presents with  . Follow-up    HPI Dawn Rivas is a 51 y.o. female here today for her follow up breast cancer.Last mammogram was 2/28/20018.Patient had a bone density test done on 07/08/2016. Having hot flashes on Tamoxifen, but otherwise doing well.The patient was having no similar symptoms while making use of left resolve. She was placed on tamoxifen as her bone density changed from osteopenia and osteoporosis in 2017. She has gain 15 pounds since her last visit in spite of going to the gym daily. This end the persistent hot flashes are having her look at alternatives to tamoxifen.  The patient is making use of calcium and vitamin D as requested last year.  Her PCP had strongly encouraged her to continue the antiestrogen therapy.    HPI  Past Medical History:  Diagnosis Date  . Breast cancer ([place]) 2015   left  . Cancer ([place]) 2015   Left breast, 2 microscopic foci residual tumor: 3.5 mm and one 0.0 mm respectively. Sentinel nodes negative x3. Closest margin 4 mm. Definitive affective neoadjuvant chemotherapy identified both of the breast sentinel lymph node. YyPT1a, N0.  ER 5%, PR 5%, HER-2/neu is not amplified.    Past Surgical History:  Procedure Laterality Date  . AUGMENTATION MAMMAPLASTY Bilateral    breast implants  . BREAST BIOPSY Left 2015  . BREAST ENHANCEMENT SURGERY  7 years ago  . BREAST LUMPECTOMY Left 01/05/2014   Wide excision, mastoplasty, sentinel node biopsy.  . COLONOSCOPY  08/01/2014  . PORTACATH PLACEMENT  07/24/13  . TUBAL LIGATION  1997  . UPPER GI ENDOSCOPY  09/28/13   hiatus hernia, la grade a reflux esophagitis, normal stomach, normal duodenum    Family History  Problem Relation Age of Onset  . Cervical cancer Mother   . Uterine cancer Mother   . Lung cancer Maternal Grandfather   . Heart disease Father   . Heart attack Father   . Stroke  Paternal Grandmother   . Alzheimer's disease Paternal Grandmother   . Heart attack Paternal Grandfather   . Breast cancer Maternal Aunt     Social History Social History  Substance Use Topics  . Smoking status: Never Smoker  . Smokeless tobacco: Never Used  . Alcohol use Yes    Allergies  Allergen Reactions  . Penicillins Hives  . Sulfur Hives    Current Outpatient Prescriptions  Medication Sig Dispense Refill  . furosemide (LASIX) 20 MG tablet Take 1 tablet (20 mg total) by mouth daily. 90 tablet 1  . letrozole (FEMARA) 2.5 MG tablet Take 1 tablet (2.5 mg total) by mouth daily. 90 tablet 3  . LORazepam (ATIVAN) 1 MG tablet TAKE 1/2 TO 1 TABLET BY MOUTH AS NEEDED FOR INSOMNIA 30 tablet 5  . omeprazole (PRILOSEC) 20 MG capsule Take 1 capsule by mouth daily.    . potassium chloride (K-DUR,KLOR-CON) 10 MEQ tablet Take 1 tablet (10 mEq total) by mouth daily as needed. When you take furosemide 90 tablet 0  . tamoxifen (NOLVADEX) 20 MG tablet Take 1 tablet (20 mg total) by mouth daily. 90 tablet 3  . valACYclovir (VALTREX) 1000 MG tablet Take 2 tablets (2,000 mg total) by mouth 2 (two) times daily. (Patient taking differently: Take 2,000 mg by mouth 2 (two) times daily. As needed) 16 tablet 3  . ibandronate (BONIVA) 150 MG tablet Take 1 tablet (150  mg total) by mouth every 30 (thirty) days. Take in the morning with a full glass of water, on an empty stomach, and do not take anything else by mouth or lie down for the next 30 min. 3 tablet 3   No current facility-administered medications for this visit.     Review of Systems Review of Systems  Constitutional: Negative.   Respiratory: Negative.   Cardiovascular: Negative.     Blood pressure 124/74, pulse 84, resp. rate 12, height _0  (1.575 m), weight 159 lb (72.1 kg).  Physical Exam Physical Exam  Constitutional: She is oriented to person, place, and time. She appears well-developed and well-nourished.  Eyes: Conjunctivae are  normal. No scleral icterus.  Neck: Neck supple.  Cardiovascular: Normal rate, regular rhythm and normal heart sounds.   Pulmonary/Chest: Effort normal and breath sounds normal. Right breast exhibits no inverted nipple, no mass, no nipple discharge, no skin change and no tenderness. Left breast exhibits no inverted nipple, no mass, no nipple discharge, no skin change and no tenderness.    Left breast thickening at 7 o'clock.  Lymphadenopathy:    She has no cervical adenopathy.    She has no axillary adenopathy.  Neurological: She is alert and oriented to person, place, and time.  Skin: Skin is warm and dry.    Data Reviewed Bilateral diagnostic mammogram scheduled by her PCP completed on 07/08/2016 were reviewed. Postsurgical changes. BI-RADS-2.  Assessment    Stable breast exam now lost 3 years status post wide excision followed by whole breast radiation.  Osteoporosis on bone density testing.  Poor tolerance of tamoxifen with weight gain and vasomotor symptoms.    Plan   Options for management reviewed: 1) stop all antiestrogen drugs versus 2) reinstitute Femara and make use of a bisphosphonate (Boniva) versus 3) addition of Effexor to control her vasomotor symptoms.  Patient to stop Tamoxifen and start taking letrozole daily and Boniva once a month. Return in one year.    The patient has been asked to send a message through my chart in one month regarding her vasomotor symptoms and in 3 months regarding her weight gain.  The majority of today's visit was spent reviewing options for management of her antiestrogen therapy and treatment of her osteoporosis.      This information has been scribed by Gaspar Cola CMA.   Robert Bellow 07/20/2016, 5:22 PM

## 2016-07-20 NOTE — Telephone Encounter (Signed)
-----   Message from Mar Daring, PA-C sent at 07/20/2016  8:31 AM EDT ----- Pap is normal, HPV negative.  Will repeat in 3-5 years. It did show yeast, however. Would recommend trying monistat OTC.

## 2016-09-02 ENCOUNTER — Encounter: Payer: Self-pay | Admitting: General Surgery

## 2016-10-07 ENCOUNTER — Telehealth: Payer: Self-pay | Admitting: Physician Assistant

## 2016-10-07 DIAGNOSIS — B001 Herpesviral vesicular dermatitis: Secondary | ICD-10-CM

## 2016-10-07 MED ORDER — VALACYCLOVIR HCL 1 G PO TABS: 2000.0000 mg | ORAL_TABLET | Freq: Two times a day (BID) | ORAL | 5 refills | Status: DC

## 2016-10-07 NOTE — Telephone Encounter (Signed)
Please review. Thanks!  

## 2016-10-07 NOTE — Telephone Encounter (Signed)
Valtrex refilled.

## 2016-10-07 NOTE — Telephone Encounter (Signed)
[facility] pharmacy faxed a request on the following medication. Thanks CC   valACYclovir (VALTREX) 1000 MG tablet  >Take 2 tablets ( 2,000 MG Total ) by mouth 2 ( Two ) times.

## 2016-10-08 ENCOUNTER — Other Ambulatory Visit: Payer: Self-pay | Admitting: Physician Assistant

## 2016-10-08 DIAGNOSIS — F419 Anxiety disorder, unspecified: Secondary | ICD-10-CM

## 2016-10-08 NOTE — Telephone Encounter (Signed)
Prescription called in to [facility]. Spoke to pharmacist [facility]

## 2017-01-13 ENCOUNTER — Ambulatory Visit: Payer: BLUE CROSS/BLUE SHIELD

## 2017-05-14 ENCOUNTER — Other Ambulatory Visit: Payer: Self-pay | Admitting: Physician Assistant

## 2017-05-14 DIAGNOSIS — F419 Anxiety disorder, unspecified: Secondary | ICD-10-CM

## 2017-05-14 NOTE — Telephone Encounter (Signed)
Prescription detailed left on the pharmacy voicemail. [facility] [place].  Thanks,  -Aundraya Dripps

## 2017-06-01 ENCOUNTER — Telehealth: Payer: Self-pay | Admitting: Physician Assistant

## 2017-06-01 DIAGNOSIS — M818 Other osteoporosis without current pathological fracture: Secondary | ICD-10-CM

## 2017-06-01 DIAGNOSIS — C50512 Malignant neoplasm of lower-outer quadrant of left female breast: Secondary | ICD-10-CM

## 2017-06-01 DIAGNOSIS — Z170 Estrogen receptor positive status [ER+]: Secondary | ICD-10-CM

## 2017-06-01 NOTE — Telephone Encounter (Signed)
Patient had diagnostic mammogram on 07/08/16 and BMD on same date as well, given patients history of osteopenia I wasn't sure if this needed to be done yearly or every 2 years. Please review chart and advise. KW

## 2017-06-01 NOTE — Telephone Encounter (Signed)
Mammogram and bone density ordered. 

## 2017-06-01 NOTE — Telephone Encounter (Signed)
Patient is calling to say its time for her yearly mammogram. She wants to know if she needs a diagnostic mammogram or screening with her history of breast CA and also does she need a bone density again?

## 2017-06-14 ENCOUNTER — Encounter: Payer: Self-pay | Admitting: *Deleted

## 2017-06-23 ENCOUNTER — Other Ambulatory Visit: Payer: Self-pay | Admitting: General Surgery

## 2017-07-12 ENCOUNTER — Ambulatory Visit
Admission: RE | Admit: 2017-07-12 | Discharge: 2017-07-12 | Disposition: A | Discharge status: Home / Self Care | Payer: BLUE CROSS/BLUE SHIELD | Source: Ambulatory Visit | Attending: Physician Assistant | Admitting: Physician Assistant

## 2017-07-12 ENCOUNTER — Telehealth: Payer: Self-pay

## 2017-07-12 DIAGNOSIS — C50512 Malignant neoplasm of lower-outer quadrant of left female breast: Secondary | ICD-10-CM

## 2017-07-12 DIAGNOSIS — M818 Other osteoporosis without current pathological fracture: Secondary | ICD-10-CM

## 2017-07-12 DIAGNOSIS — Z170 Estrogen receptor positive status [ER+]: Secondary | ICD-10-CM | POA: Diagnosis present

## 2017-07-12 DIAGNOSIS — Z7952 Long term (current) use of systemic steroids: Secondary | ICD-10-CM | POA: Insufficient documentation

## 2017-07-12 DIAGNOSIS — Z9882 Breast implant status: Secondary | ICD-10-CM | POA: Insufficient documentation

## 2017-07-12 DIAGNOSIS — Z9889 Other specified postprocedural states: Secondary | ICD-10-CM | POA: Insufficient documentation

## 2017-07-12 DIAGNOSIS — M8588 Other specified disorders of bone density and structure, other site: Secondary | ICD-10-CM | POA: Diagnosis not present

## 2017-07-12 DIAGNOSIS — Z79811 Long term (current) use of aromatase inhibitors: Secondary | ICD-10-CM | POA: Insufficient documentation

## 2017-07-12 DIAGNOSIS — Z853 Personal history of malignant neoplasm of breast: Secondary | ICD-10-CM | POA: Insufficient documentation

## 2017-07-12 NOTE — Telephone Encounter (Signed)
Viewed by Encarnacion Slates on 07/12/2017 2:33 PM

## 2017-07-12 NOTE — Telephone Encounter (Signed)
-----   Message from Mar Daring, PA-C sent at 07/12/2017  2:15 PM EST ----- Normal mammogram. Repeat diagnostic screening in one year.

## 2017-07-19 ENCOUNTER — Ambulatory Visit (INDEPENDENT_AMBULATORY_CARE_PROVIDER_SITE_OTHER): Payer: BLUE CROSS/BLUE SHIELD | Admitting: Physician Assistant

## 2017-07-19 ENCOUNTER — Encounter: Payer: Self-pay | Admitting: Physician Assistant

## 2017-07-19 VITALS — BP 100/70 | HR 92 | Temp 97.7°F | Resp 16 | Ht 62.0 in | Wt 128.2 lb

## 2017-07-19 DIAGNOSIS — E7800 Pure hypercholesterolemia, unspecified: Secondary | ICD-10-CM

## 2017-07-19 DIAGNOSIS — C50512 Malignant neoplasm of lower-outer quadrant of left female breast: Secondary | ICD-10-CM

## 2017-07-19 DIAGNOSIS — R7309 Other abnormal glucose: Secondary | ICD-10-CM

## 2017-07-19 DIAGNOSIS — Z0000 Encounter for general adult medical examination without abnormal findings: Secondary | ICD-10-CM

## 2017-07-19 DIAGNOSIS — Z170 Estrogen receptor positive status [ER+]: Secondary | ICD-10-CM | POA: Diagnosis not present

## 2017-07-19 NOTE — Patient Instructions (Signed)

## 2017-07-19 NOTE — Progress Notes (Signed)
Patient: Dawn Rivas, Female    DOB: 08/08/1965, 52 y.o.   MRN: [id number] Visit Date: 07/19/2017  Today's Provider: Mar Daring, PA-C   Chief Complaint  Patient presents with  . Annual Exam   Subjective:    Annual physical exam Dawn Rivas is a 52 y.o. female who presents today for health maintenance and complete physical. She feels well. She reports exercising. She reports she is sleeping well with medication.  Last CPE:07/16/2016 Pap:07/16/2016 Normal,HPV-Negative Colonoscopy:08/01/2014 Repeat in 10 yrs Mammogram:07/12/17 per letter that was sent to patient is Normal. Flu Vaccine: Per patient [facility]. -----------------------------------------------------------------   Review of Systems  Constitutional: Negative.   HENT: Negative.   Eyes: Negative.   Respiratory: Negative.   Cardiovascular: Negative.   Gastrointestinal: Negative.   Endocrine: Negative.   Genitourinary: Negative.   Musculoskeletal: Negative.   Skin: Negative.   Allergic/Immunologic: Negative.   Neurological: Negative.   Hematological: Negative.   Psychiatric/Behavioral: Positive for sleep disturbance.    Social History      She  reports that  has never smoked. she has never used smokeless tobacco. She reports that she drinks alcohol. She reports that she does not use drugs.       Social History   Socioeconomic History  . Marital status: Married    Spouse name: None  . Number of children: 3  . Years of education: None  . Highest education level: None  Social Needs  . Financial resource strain: None  . Food insecurity - worry: None  . Food insecurity - inability: None  . Transportation needs - medical: None  . Transportation needs - non-medical: None  Occupational History    Employer: [employer]  Tobacco Use  . Smoking status: Never Smoker  . Smokeless tobacco: Never Used  Substance and Sexual Activity  . Alcohol use: Yes  . Drug  use: No  . Sexual activity: None  Other Topics Concern  . None  Social History Narrative  . None    Past Medical History:  Diagnosis Date  . Breast cancer ([place]) 2015   left  . Cancer ([place]) 2015   Left breast, 2 microscopic foci residual tumor: 3.5 mm and one 0.0 mm respectively. Sentinel nodes negative x3. Closest margin 4 mm. Definitive affective neoadjuvant chemotherapy identified both of the breast sentinel lymph node. YyPT1a, N0.  ER 5%, PR 5%, HER-2/neu is not amplified.     Patient Active Problem List   Diagnosis Date Noted  . Hematuria 07/16/2015  . Abnormal blood sugar 07/16/2015  . High risk medication use 07/16/2015  . Osteoporosis 07/16/2015  . GERD (gastroesophageal reflux disease) 07/16/2015  . Absence of menstruation 06/19/2015  . Adult BMI 30+ 06/19/2015  . Breast carcinoma ([place]) 06/19/2015  . Cold sore 06/19/2015  . Calcium blood increased 06/19/2015  . Cannot sleep 06/19/2015  . Irregular bleeding 06/19/2015  . Hypercholesterolemia without hypertriglyceridemia 06/19/2015  . Allergic rhinitis, seasonal 06/19/2015  . Anxiety 02/18/2015  . Encounter for screening colonoscopy 07/10/2014  . Breast cancer ([place]) 07/17/2013    Past Surgical History:  Procedure Laterality Date  . AUGMENTATION MAMMAPLASTY Bilateral    breast implants  . BREAST BIOPSY Left 2015   Left breast, 2 microscopic foci residual tumor: 3.5 mm and one 0.0 mm respectively. Sentinel nodes negative x3. Closest margin 4 mm. Definitive affective neoadjuvant chemotherapy identified both of the breast sentinel lymph node. YyPT1a, N0.  ER 5%, PR  5%, HER-2/neu is not amplified.  Marland Kitchen BREAST ENHANCEMENT SURGERY  7 years ago  . BREAST LUMPECTOMY Left 01/05/2014   Wide excision, mastoplasty, sentinel node biopsy. F/U Radiation and Chemo  . COLONOSCOPY  08/01/2014  . PORTACATH PLACEMENT  07/24/13  . TUBAL LIGATION  1997  . UPPER GI ENDOSCOPY  09/28/13   hiatus hernia, la grade a reflux esophagitis,  normal stomach, normal duodenum    Family History        Family Status  Relation Name Status  . Mother  Alive  . MGF  Deceased at age 45  . Father  Alive  . Sister  Alive  . Brother  Alive  . Son  Alive  . MGM  Alive  . PGM  Deceased at age 34  . PGF  Deceased at age 27  . Son  Alive  . Son  Alive  . Mat Aunt  (Not Specified)        Her family history includes Alzheimer's disease in her paternal grandmother; Breast cancer in her maternal aunt; Cervical cancer in her mother; Heart attack in her father and paternal grandfather; Heart disease in her father; Lung cancer in her maternal grandfather; Stroke in her paternal grandmother; Uterine cancer in her mother.      Allergies  Allergen Reactions  . Penicillins Hives  . Sulfur Hives     Current Outpatient Medications:  .  aspirin EC 81 MG tablet, Take by mouth., Disp: , Rfl:  .  BIOTIN PO, Take by mouth., Disp: , Rfl:  .  Calcium-Vitamin D-Vitamin K (VIACTIV PO), Take by mouth., Disp: , Rfl:  .  ibandronate (BONIVA) 150 MG tablet, TAKE 1 TABLET BY MOUTH EVERY 30 DAYS, Disp: 3 tablet, Rfl: 3 .  letrozole (FEMARA) 2.5 MG tablet, TAKE 1 TABLET (2.5 MG TOTAL) BY MOUTH DAILY., Disp: 90 tablet, Rfl: 3 .  LORazepam (ATIVAN) 1 MG tablet, TAKE 0.5-1 TABLETS BY MOUTH AT BEDTIME AS NEEDED FOR INSOMNIA, Disp: 30 tablet, Rfl: 5 .  Multiple Vitamins-Minerals (CENTRUM SILVER PO), Take by mouth., Disp: , Rfl:  .  valACYclovir (VALTREX) 1000 MG tablet, Take 2 tablets (2,000 mg total) by mouth 2 (two) times daily. As needed, Disp: 16 tablet, Rfl: 5 .  furosemide (LASIX) 20 MG tablet, Take 1 tablet (20 mg total) by mouth daily., Disp: 90 tablet, Rfl: 1 .  omeprazole (PRILOSEC) 20 MG capsule, Take 1 capsule by mouth daily., Disp: , Rfl:  .  potassium chloride (K-DUR,KLOR-CON) 10 MEQ tablet, Take 1 tablet (10 mEq total) by mouth daily as needed. When you take furosemide, Disp: 90 tablet, Rfl: 0 .  tamoxifen (NOLVADEX) 20 MG tablet, Take 1 tablet  (20 mg total) by mouth daily., Disp: 90 tablet, Rfl: 3   Patient Care Team: Mar Daring, PA-C as PCP - General (Family Medicine) Bary Castilla, Forest Gleason, MD (General Surgery) Margarita Rana, MD as Referring Physician (Family Medicine) Kennieth Francois, MD (Inactive) as Consulting Physician (Hematology and Oncology) Lloyd Huger, MD as Consulting Physician (Oncology)      Objective:   Vitals: BP 100/70 (BP Location: Right Arm, Patient Position: Sitting, Cuff Size: Normal)   Pulse 92   Temp 97.7 F (36.5 C) (Oral)   Resp 16   Ht '5\' 2"'  (1.575 m)   Wt 128 lb 3.2 oz (58.2 kg)   SpO2 98%   BMI 23.45 kg/m    Vitals:   07/19/17 0858  BP: 100/70  Pulse: 92  Resp: 16  Temp:  97.7 F (36.5 C)  TempSrc: Oral  SpO2: 98%  Weight: 128 lb 3.2 oz (58.2 kg)  Height: '5\' 2"'  (1.575 m)     Physical Exam  Constitutional: She is oriented to person, place, and time. She appears well-developed and well-nourished.  HENT:  Head: Normocephalic and atraumatic.  Right Ear: Hearing, tympanic membrane, external ear and ear canal normal.  Left Ear: Hearing, tympanic membrane, external ear and ear canal normal.  Nose: Nose normal.  Mouth/Throat: Uvula is midline, oropharynx is clear and moist and mucous membranes are normal.  Eyes: Conjunctivae and EOM are normal. Pupils are equal, round, and reactive to light.  Neck: Normal range of motion. Neck supple. Carotid bruit is not present.  Cardiovascular: Normal rate, regular rhythm, normal heart sounds and intact distal pulses.  Pulmonary/Chest: Effort normal and breath sounds normal.  Deferred to Dr. Bary Castilla  Abdominal: Soft. Bowel sounds are normal.  Musculoskeletal: Normal range of motion.  Neurological: She is alert and oriented to person, place, and time. She has normal reflexes.  Skin: Skin is warm and dry.  Psychiatric: She has a normal mood and affect. Her behavior is normal. Judgment and thought content normal.     Depression  Screen PHQ 2/9 Scores 07/19/2017 07/16/2016  PHQ - 2 Score 0 0  PHQ- 9 Score - 0      Assessment & Plan:     Routine Health Maintenance and Physical Exam  Exercise Activities and Dietary recommendations Goals    None      Immunization History  Administered Date(s) Administered  . Tdap 01/07/2012    Health Maintenance  Topic Date Due  . INFLUENZA VACCINE  12/09/2016  . MAMMOGRAM  07/13/2019  . PAP SMEAR  07/17/2019  . TETANUS/TDAP  01/06/2022  . COLONOSCOPY  07/31/2024  . Hepatitis C Screening  Completed  . HIV Screening  Completed     Discussed health benefits of physical activity, and encouraged her to engage in regular exercise appropriate for her age and condition.    1. Annual physical exam Normal physical exam today. Will check labs as below and f/u pending lab results. If labs are stable and WNL she will not need to have these rechecked for one year at her next annual physical exam. She is to call the office in the meantime if she has any acute issue, questions or concerns. - CBC with Differential/Platelet - Comprehensive metabolic panel - Hemoglobin A1c - Lipid panel - TSH  2. Malignant neoplasm of lower-outer quadrant of left breast of female, estrogen receptor positive ([place]) Followed by Dr. Bary Castilla - CBC with Differential/Platelet  3. Hypercholesterolemia without hypertriglyceridemia Diet controlled. Lost almost 40 pounds in last year with healthy lifestyle. Will check labs as below and f/u pending results. - CBC with Differential/Platelet - Comprehensive metabolic panel - Hemoglobin A1c - Lipid panel - TSH  4. Abnormal blood sugar Diet controlled. Lost almost 40 pounds in last year with healthy lifestyle. Will check labs as below and f/u pending results. - CBC with Differential/Platelet - Comprehensive metabolic panel - Hemoglobin A1c - Lipid panel - TSH  5. Calcium blood increased Will check labs as below and f/u pending results. - CBC with  Differential/Platelet - Comprehensive metabolic panel - Lipid panel - TSH  --------------------------------------------------------------------    Mar Daring, PA-C  [facility]

## 2017-07-20 ENCOUNTER — Encounter: Payer: Self-pay | Admitting: General Surgery

## 2017-07-20 ENCOUNTER — Ambulatory Visit: Payer: BLUE CROSS/BLUE SHIELD | Admitting: General Surgery

## 2017-07-20 ENCOUNTER — Telehealth: Payer: Self-pay

## 2017-07-20 VITALS — BP 122/70 | HR 76 | Resp 16 | Ht 60.0 in | Wt 128.0 lb

## 2017-07-20 DIAGNOSIS — C50512 Malignant neoplasm of lower-outer quadrant of left female breast: Secondary | ICD-10-CM | POA: Diagnosis not present

## 2017-07-20 DIAGNOSIS — Z170 Estrogen receptor positive status [ER+]: Secondary | ICD-10-CM | POA: Diagnosis not present

## 2017-07-20 LAB — CBC WITH DIFFERENTIAL/PLATELET
BASOS: 0 %
Basophils Absolute: 0 10*3/uL (ref 0.0–0.2)
EOS (ABSOLUTE): 0.1 10*3/uL (ref 0.0–0.4)
Eos: 2 %
Hematocrit: 39.4 % (ref 34.0–46.6)
Hemoglobin: 12.9 g/dL (ref 11.1–15.9)
IMMATURE GRANS (ABS): 0 10*3/uL (ref 0.0–0.1)
IMMATURE GRANULOCYTES: 0 %
LYMPHS: 30 %
Lymphocytes Absolute: 1.7 10*3/uL (ref 0.7–3.1)
MCH: 31.9 pg (ref 26.6–33.0)
MCHC: 32.7 g/dL (ref 31.5–35.7)
MCV: 97 fL (ref 79–97)
MONOCYTES: 7 %
Monocytes Absolute: 0.4 10*3/uL (ref 0.1–0.9)
Neutrophils Absolute: 3.6 10*3/uL (ref 1.4–7.0)
Neutrophils: 61 %
PLATELETS: 189 10*3/uL (ref 150–379)
RBC: 4.05 x10E6/uL (ref 3.77–5.28)
RDW: 13.1 % (ref 12.3–15.4)
WBC: 5.9 10*3/uL (ref 3.4–10.8)

## 2017-07-20 LAB — COMPREHENSIVE METABOLIC PANEL
ALT: 27 IU/L (ref 0–32)
AST: 22 IU/L (ref 0–40)
Albumin/Globulin Ratio: 1.8 (ref 1.2–2.2)
Albumin: 4.4 g/dL (ref 3.5–5.5)
Alkaline Phosphatase: 32 IU/L — ABNORMAL LOW (ref 39–117)
BUN/Creatinine Ratio: 20 (ref 9–23)
BUN: 16 mg/dL (ref 6–24)
Bilirubin Total: 0.4 mg/dL (ref 0.0–1.2)
CO2: 22 mmol/L (ref 20–29)
CREATININE: 0.8 mg/dL (ref 0.57–1.00)
Calcium: 9.2 mg/dL (ref 8.7–10.2)
Chloride: 103 mmol/L (ref 96–106)
GFR calc non Af Amer: 84 mL/min/{1.73_m2} (ref >59)
GFR, EST AFRICAN AMERICAN: 97 mL/min/{1.73_m2} (ref >59)
GLUCOSE: 83 mg/dL (ref 65–99)
Globulin, Total: 2.4 g/dL (ref 1.5–4.5)
Potassium: 4.3 mmol/L (ref 3.5–5.2)
Sodium: 139 mmol/L (ref 134–144)
TOTAL PROTEIN: 6.8 g/dL (ref 6.0–8.5)

## 2017-07-20 LAB — HEMOGLOBIN A1C
Est. average glucose Bld gHb Est-mCnc: 97 mg/dL
Hgb A1c MFr Bld: 5 % (ref 4.8–5.6)

## 2017-07-20 LAB — LIPID PANEL
CHOLESTEROL TOTAL: 190 mg/dL (ref 100–199)
Chol/HDL Ratio: 3.2 ratio (ref 0.0–4.4)
HDL: 59 mg/dL (ref >39)
LDL CALC: 117 mg/dL — AB (ref 0–99)
TRIGLYCERIDES: 72 mg/dL (ref 0–149)
VLDL CHOLESTEROL CAL: 14 mg/dL (ref 5–40)

## 2017-07-20 LAB — TSH: TSH: 1.9 u[IU]/mL (ref 0.450–4.500)

## 2017-07-20 NOTE — Progress Notes (Signed)
Patient ID: Katina B Narducci, female   DOB: 04/14/1966, 51 y.o.   MRN: [id number]  Chief Complaint  Patient presents with  . Breast Cancer    HPI Tabithia B Pembroke is a 51 y.o. female who presents for a breast evaluation. The most recent mammogram was done on 07/12/2017.  Patient does perform regular self breast checks and gets regular mammograms done. Patient states she her hair is felling out bad and needs seven teeth filled because of cracks in her teeth. Been taking biotin for four months.  The patient has noticed a marked improvement in her weight since discontinuing tamoxifen and resuming letrozole.   Past Medical History:  Diagnosis Date  . Breast cancer ([facility]) 2015   left  . Cancer ([facility]) 2015   Left breast, 2 microscopic foci residual tumor: 3.5 mm and one 0.0 mm respectively. Sentinel nodes negative x3. Closest margin 4 mm. Definitive affective neoadjuvant chemotherapy identified both of the breast sentinel lymph node. YyPT1a, N0.  ER 5%, PR 5%, HER-2/neu is not amplified.    Past Surgical History:  Procedure Laterality Date  . AUGMENTATION MAMMAPLASTY Bilateral    breast implants  . BREAST BIOPSY Left 2015   Left breast, 2 microscopic foci residual tumor: 3.5 mm and one 0.0 mm respectively. Sentinel nodes negative x3. Closest margin 4 mm. Definitive affective neoadjuvant chemotherapy identified both of the breast sentinel lymph node. YyPT1a, N0.  ER 5%, PR 5%, HER-2/neu is not amplified.  . BREAST ENHANCEMENT SURGERY  7 years ago  . BREAST LUMPECTOMY Left 01/05/2014   Wide excision, mastoplasty, sentinel node biopsy. F/U Radiation and Chemo  . COLONOSCOPY  08/01/2014  . PORTACATH PLACEMENT  07/24/13  . TUBAL LIGATION  1997  . UPPER GI ENDOSCOPY  09/28/13   hiatus hernia, la grade a reflux esophagitis, normal stomach, normal duodenum    Family History  Problem Relation Age of Onset  . Cervical cancer Mother   . Uterine cancer Mother   . Lung cancer Maternal Grandfather   . Heart  disease Father   . Heart attack Father   . Stroke Paternal Grandmother   . Alzheimer's disease Paternal Grandmother   . Heart attack Paternal Grandfather   . Breast cancer Maternal Aunt     Social History Social History   Tobacco Use  . Smoking status: Never Smoker  . Smokeless tobacco: Never Used  Substance Use Topics  . Alcohol use: Yes  . Drug use: No    Allergies  Allergen Reactions  . Penicillins Hives  . Sulfur Hives    Current Outpatient Medications  Medication Sig Dispense Refill  . aspirin EC 81 MG tablet Take by mouth.    . BIOTIN PO Take by mouth.    . Calcium-Vitamin D-Vitamin K (VIACTIV PO) Take by mouth.    . ibandronate (BONIVA) 150 MG tablet TAKE 1 TABLET BY MOUTH EVERY 30 DAYS 3 tablet 3  . letrozole (FEMARA) 2.5 MG tablet TAKE 1 TABLET (2.5 MG TOTAL) BY MOUTH DAILY. 90 tablet 3  . LORazepam (ATIVAN) 1 MG tablet TAKE 0.5-1 TABLETS BY MOUTH AT BEDTIME AS NEEDED FOR INSOMNIA 30 tablet 5  . Multiple Vitamins-Minerals (CENTRUM SILVER PO) Take by mouth.    . omeprazole (PRILOSEC) 20 MG capsule Take 1 capsule by mouth daily.    . valACYclovir (VALTREX) 1000 MG tablet Take 2 tablets (2,000 mg total) by mouth 2 (two) times daily. As needed 16 tablet 5   No current facility-administered medications for this visit.       Review of Systems Review of Systems  Constitutional: Negative.   Respiratory: Negative.   Cardiovascular: Negative.     Blood pressure 122/70, pulse 76, resp. rate 16, height 5' (1.524 m), weight 128 lb (58.1 kg).  Physical Exam Physical Exam  Constitutional: She is oriented to person, place, and time. She appears well-developed and well-nourished.  Cardiovascular: Normal rate, regular rhythm and normal heart sounds.  Pulmonary/Chest: Effort normal and breath sounds normal. Right breast exhibits no inverted nipple, no mass, no nipple discharge, no skin change and no tenderness. Left breast exhibits no inverted nipple, no mass, no nipple  discharge, no skin change and no tenderness.    Lymphadenopathy:    She has no cervical adenopathy.    She has no axillary adenopathy.  Neurological: She is alert and oriented to person, place, and time.  Skin: Skin is warm and dry.    Data Reviewed Bone density of July 12, 2017 showed stable bone density.  Still osteopenia.  Tolerating Boniva well. Bilateral mammograms dated July 12, 2017 were reviewed.  Postsurgical changes.  BI-RADS-2.  Assessment    No evidence of recurrent breast cancer.  Stable osteopenia.      Plan  The patient's PCP has been ordering her mammograms.  We will plan for a follow-up after her March 2020 studies.  The patient has been asked to return to the office in one year with a bilateral diagnostic mammogram. Ms Tollie Pizza, PA-C  orders her mammograms. The patient is aware to call back for any questions or concerns.   HPI, Physical Exam, Assessment and Plan have been scribed under the direction and in the presence of Hervey Ard, MD.  Gaspar Cola, CMA  I have completed the exam and reviewed the above documentation for accuracy and completeness.  I agree with the above.  Haematologist has been used and any errors in dictation or transcription are unintentional.  Hervey Ard, M.D., F.A.C.S.  Forest Gleason Won Kreuzer 07/21/2017, 9:26 PM

## 2017-07-20 NOTE — Telephone Encounter (Signed)
-----   Message from Mar Daring, PA-C sent at 07/20/2017  8:23 AM EDT ----- All labs are within normal limits and stable.  Thanks! -JB

## 2017-07-20 NOTE — Telephone Encounter (Signed)
Viewed by Encarnacion Slates on 07/20/2017 9:54 AM

## 2017-07-20 NOTE — Patient Instructions (Signed)
The patient has been asked to return to the office in one year with a bilateral diagnostic mammogram. Dr. Tollie Pizza orders her mammograms. The patient is aware to call back for any questions or concerns.

## 2017-08-17 ENCOUNTER — Ambulatory Visit: Payer: BLUE CROSS/BLUE SHIELD | Admitting: Physician Assistant

## 2017-08-17 ENCOUNTER — Encounter: Payer: Self-pay | Admitting: Physician Assistant

## 2017-08-17 VITALS — BP 122/76 | HR 88 | Temp 98.3°F | Resp 16 | Wt 128.0 lb

## 2017-08-17 DIAGNOSIS — N3090 Cystitis, unspecified without hematuria: Secondary | ICD-10-CM | POA: Diagnosis not present

## 2017-08-17 LAB — POCT URINALYSIS DIPSTICK
Bilirubin, UA: NEGATIVE
Glucose, UA: NEGATIVE
Ketones, UA: NEGATIVE
Nitrite, UA: NEGATIVE
Spec Grav, UA: 1.02 (ref 1.010–1.025)
Urobilinogen, UA: 0.2 E.U./dL
pH, UA: 6.5 (ref 5.0–8.0)

## 2017-08-17 MED ORDER — NITROFURANTOIN MONOHYD MACRO 100 MG PO CAPS: 100.0000 mg | ORAL_CAPSULE | Freq: Two times a day (BID) | ORAL | 0 refills | Status: AC

## 2017-08-17 NOTE — Patient Instructions (Signed)

## 2017-08-17 NOTE — Progress Notes (Signed)
[place]  Chief Complaint  Patient presents with  . Urinary Tract Infection    Subjective:    Patient ID: Dawn Rivas, female    DOB: 11-19-1965, 52 y.o.   MRN: [id number]   Urinary Tract Infection: Patient complains of frequency, incomplete bladder emptying and urgency She has had symptoms for 1 day. Patient denies back pain, cough, stomach ache and vaginal discharge. Patient does have a history of recurrent UTI but pt reports it has been years since her last one.  Patient does not have a history of pyelonephritis or other renal issues. Patient denies vaginal discharge and denies new sexual partners. The patient denies recent travel outside of the [place].  Review of Systems  Constitutional: Positive for malaise/fatigue. Negative for chills, diaphoresis, fever and weight loss.  Gastrointestinal: Negative.   Genitourinary: Positive for dysuria, frequency and urgency. Negative for flank pain and hematuria.  Neurological: Negative for dizziness and headaches.       Objective:   BP 122/76 (BP Location: Right Arm, Patient Position: Sitting, Cuff Size: Normal)   Pulse 88   Temp 98.3 F (36.8 C) (Oral)   Resp 16   Wt 128 lb (58.1 kg)   BMI 25.00 kg/m   Patient Active Problem List   Diagnosis Date Noted  . Hematuria 07/16/2015  . Abnormal blood sugar 07/16/2015  . High risk medication use 07/16/2015  . Osteoporosis 07/16/2015  . GERD (gastroesophageal reflux disease) 07/16/2015  . Absence of menstruation 06/19/2015  . Adult BMI 30+ 06/19/2015  . Breast carcinoma ([place]) 06/19/2015  . Cold sore 06/19/2015  . Calcium blood increased 06/19/2015  . Cannot sleep 06/19/2015  . Irregular bleeding 06/19/2015  . Hypercholesterolemia without hypertriglyceridemia 06/19/2015  . Allergic rhinitis, seasonal 06/19/2015  . Anxiety 02/18/2015  . Encounter for screening colonoscopy 07/10/2014  . Breast cancer ([place]) 07/17/2013    Outpatient  Encounter Medications as of 08/17/2017  Medication Sig Note  . aspirin EC 81 MG tablet Take by mouth.   Marland Kitchen BIOTIN PO Take by mouth.   . Calcium-Vitamin D-Vitamin K (VIACTIV PO) Take by mouth.   . ibandronate (BONIVA) 150 MG tablet TAKE 1 TABLET BY MOUTH EVERY 30 DAYS   . letrozole (FEMARA) 2.5 MG tablet TAKE 1 TABLET (2.5 MG TOTAL) BY MOUTH DAILY.   Marland Kitchen LORazepam (ATIVAN) 1 MG tablet TAKE 0.5-1 TABLETS BY MOUTH AT BEDTIME AS NEEDED FOR INSOMNIA   . Multiple Vitamins-Minerals (CENTRUM SILVER PO) Take by mouth.   Marland Kitchen omeprazole (PRILOSEC) 20 MG capsule Take 1 capsule by mouth daily. 09/26/2013: Received from: External Pharmacy Received Sig:   . valACYclovir (VALTREX) 1000 MG tablet Take 2 tablets (2,000 mg total) by mouth 2 (two) times daily. As needed   . nitrofurantoin, macrocrystal-monohydrate, (MACROBID) 100 MG capsule Take 1 capsule (100 mg total) by mouth 2 (two) times daily for 5 days.    No facility-administered encounter medications on file as of 08/17/2017.     Allergies  Allergen Reactions  . Penicillins Hives  . Sulfur Hives       Physical Exam  Constitutional: She is oriented to person, place, and time. She appears well-developed and well-nourished. No distress.  Cardiovascular: Normal rate and regular rhythm.  Pulmonary/Chest: Effort normal and breath sounds normal.  Abdominal: Soft. Bowel sounds are normal. She exhibits no distension. There is tenderness in the suprapubic area. There is no rebound, no guarding and no CVA tenderness.  Neurological: She is alert and oriented to person,  place, and time.  Skin: Skin is warm and dry. She is not diaphoretic.  Psychiatric: She has a normal mood and affect. Her behavior is normal.  Vitals reviewed.      Assessment & Plan:  1. Cystitis  Urine dipstick indicates infection. Will send for culture. Treat as below due to allergies.   - nitrofurantoin, macrocrystal-monohydrate, (MACROBID) 100 MG capsule; Take 1 capsule (100 mg total) by  mouth 2 (two) times daily for 5 days.  Dispense: 10 capsule; Refill: 0 - POCT urinalysis dipstick - CULTURE, URINE COMPREHENSIVE  Return if symptoms worsen or fail to improve.  The entirety of the information documented in the History of Present Illness, Review of Systems and Physical Exam were personally obtained by me. Portions of this information were initially documented by Ashley Royalty, CMA and reviewed by me for thoroughness and accuracy.

## 2017-08-20 ENCOUNTER — Telehealth: Payer: Self-pay

## 2017-08-20 LAB — CULTURE, URINE COMPREHENSIVE

## 2017-08-20 NOTE — Telephone Encounter (Signed)
-----   Message from Trinna Post, [place] sent at 08/20/2017 12:20 PM EDT ----- Urine culture did not grow specific organism. Can stop antibiotics.

## 2017-08-20 NOTE — Telephone Encounter (Signed)
Pt advised.   Thanks,   -Grae Leathers  

## 2017-12-07 ENCOUNTER — Other Ambulatory Visit: Payer: Self-pay | Admitting: Physician Assistant

## 2017-12-07 DIAGNOSIS — F419 Anxiety disorder, unspecified: Secondary | ICD-10-CM

## 2018-05-03 ENCOUNTER — Other Ambulatory Visit: Payer: Self-pay | Admitting: Physician Assistant

## 2018-05-03 DIAGNOSIS — B001 Herpesviral vesicular dermatitis: Secondary | ICD-10-CM

## 2018-05-26 ENCOUNTER — Encounter: Payer: Self-pay | Admitting: *Deleted

## 2018-05-26 ENCOUNTER — Other Ambulatory Visit: Payer: Self-pay | Admitting: General Surgery

## 2018-05-31 ENCOUNTER — Telehealth: Payer: Self-pay | Admitting: Physician Assistant

## 2018-05-31 DIAGNOSIS — Z1239 Encounter for other screening for malignant neoplasm of breast: Secondary | ICD-10-CM

## 2018-05-31 NOTE — Telephone Encounter (Signed)
Pt needing a referral for a mammogram.  Thanks, Yoe

## 2018-05-31 NOTE — Telephone Encounter (Signed)
Please advise referral?  

## 2018-06-01 ENCOUNTER — Telehealth: Payer: Self-pay | Admitting: Physician Assistant

## 2018-06-01 DIAGNOSIS — Z853 Personal history of malignant neoplasm of breast: Secondary | ICD-10-CM

## 2018-06-01 DIAGNOSIS — Z1239 Encounter for other screening for malignant neoplasm of breast: Secondary | ICD-10-CM

## 2018-06-01 NOTE — Telephone Encounter (Signed)
Order changed.

## 2018-06-01 NOTE — Addendum Note (Signed)
Addended by: Mar Daring on: 06/01/2018 09:57 AM   Modules accepted: Orders

## 2018-06-01 NOTE — Telephone Encounter (Signed)
Can you put the order in for her please.

## 2018-06-01 NOTE — Telephone Encounter (Signed)
Patient was referred for a screening mammogram.  Patient has had breast cancer so she has to have a diagnostic mammogram and needs someone to call back to [facility] to change it to diagnostic.

## 2018-06-01 NOTE — Telephone Encounter (Signed)
Mammogram ordered

## 2018-06-01 NOTE — Telephone Encounter (Signed)
LM that order has been placed.

## 2018-06-09 ENCOUNTER — Encounter: Payer: Self-pay | Admitting: *Deleted

## 2018-06-15 ENCOUNTER — Telehealth: Payer: Self-pay | Admitting: Physician Assistant

## 2018-06-15 DIAGNOSIS — Z170 Estrogen receptor positive status [ER+]: Principal | ICD-10-CM

## 2018-06-15 DIAGNOSIS — C50512 Malignant neoplasm of lower-outer quadrant of left female breast: Secondary | ICD-10-CM

## 2018-06-15 DIAGNOSIS — Z1239 Encounter for other screening for malignant neoplasm of breast: Secondary | ICD-10-CM

## 2018-06-15 NOTE — Telephone Encounter (Signed)
Please review

## 2018-06-15 NOTE — Telephone Encounter (Signed)
Per [facility] they will need order for mammogram corrected to diagnostic bilateral mammogram TOMO,they will also need an order for right/left limited breast ultrasounds.[id number]

## 2018-06-15 NOTE — Telephone Encounter (Signed)
Orders changed. 

## 2018-07-20 ENCOUNTER — Ambulatory Visit
Admission: RE | Admit: 2018-07-20 | Discharge: 2018-07-20 | Disposition: A | Discharge status: Home / Self Care | Payer: BLUE CROSS/BLUE SHIELD | Source: Ambulatory Visit | Attending: Physician Assistant | Admitting: Physician Assistant

## 2018-07-20 ENCOUNTER — Other Ambulatory Visit: Payer: Self-pay

## 2018-07-20 ENCOUNTER — Other Ambulatory Visit: Payer: Self-pay | Admitting: Physician Assistant

## 2018-07-20 DIAGNOSIS — C50512 Malignant neoplasm of lower-outer quadrant of left female breast: Secondary | ICD-10-CM

## 2018-07-20 DIAGNOSIS — Z170 Estrogen receptor positive status [ER+]: Secondary | ICD-10-CM | POA: Diagnosis present

## 2018-07-20 DIAGNOSIS — Z1239 Encounter for other screening for malignant neoplasm of breast: Secondary | ICD-10-CM

## 2018-07-20 HISTORY — DX: Personal history of irradiation: Z92.3

## 2018-07-20 HISTORY — DX: Personal history of antineoplastic chemotherapy: Z92.21

## 2018-07-21 ENCOUNTER — Encounter: Payer: BLUE CROSS/BLUE SHIELD | Admitting: Physician Assistant

## 2018-07-21 ENCOUNTER — Encounter: Payer: Self-pay | Admitting: General Surgery

## 2018-07-21 ENCOUNTER — Other Ambulatory Visit: Payer: Self-pay

## 2018-07-21 ENCOUNTER — Ambulatory Visit: Payer: BLUE CROSS/BLUE SHIELD | Admitting: General Surgery

## 2018-07-21 VITALS — BP 120/84 | HR 96 | Temp 97.7°F | Resp 12 | Ht 60.0 in | Wt 133.0 lb

## 2018-07-21 DIAGNOSIS — Z170 Estrogen receptor positive status [ER+]: Secondary | ICD-10-CM

## 2018-07-21 DIAGNOSIS — C50512 Malignant neoplasm of lower-outer quadrant of left female breast: Secondary | ICD-10-CM | POA: Diagnosis not present

## 2018-07-21 NOTE — Progress Notes (Signed)
Patient ID: Dawn Rivas, female   DOB: [birth year], 53 y.o.   MRN: [id number]  Chief Complaint  Patient presents with  . Follow-up    1 yr f/u rec left Breast Ca    HPI Dawn Rivas is a 53 y.o. female.  Here for follow up left breast cancer, bilateral mammogram was 07-20-18. No new breast issues. Tolerating the Femara.  HPI  Past Medical History:  Diagnosis Date  . Breast cancer ([place]) 07/10/2013   T2, N1, ER/PR less than 5%, HER-2 negative.  Neoadjuvant chemotherapy. yPT1a,N0 post neoadjuvant treatment.  . Cancer ([place]) 2015   Invasive mammary carcinoma left breast, 2 microscopic foci residual tumor: 3.5 mm and one 0.0 mm respectively. Sentinel nodes negative x3. Closest margin 4 mm. Definitive affective neoadjuvant chemotherapy identified both of the breast sentinel lymph node. YyPT1a, N0.  ER 5%, PR 5%, HER-2/neu is not amplified.  . Personal history of chemotherapy   . Personal history of radiation therapy     Past Surgical History:  Procedure Laterality Date  . AUGMENTATION MAMMAPLASTY Bilateral    breast implants  . BREAST BIOPSY Left 2015   Left breast, 2 microscopic foci residual tumor: 3.5 mm and one 0.0 mm respectively. Sentinel nodes negative x3. Closest margin 4 mm. Definitive affective neoadjuvant chemotherapy identified both of the breast sentinel lymph node. YyPT1a, N0.  ER 5%, PR 5%, HER-2/neu is not amplified.  Marland Kitchen BREAST ENHANCEMENT SURGERY  7 years ago  . BREAST LUMPECTOMY Left 01/05/2014   Wide excision, mastoplasty, sentinel node biopsy. F/U Radiation and Chemo  . COLONOSCOPY  08/01/2014   Normal, repeat 2026.  Marland Kitchen PORTACATH PLACEMENT  07/24/13  . TUBAL LIGATION  1997  . UPPER GI ENDOSCOPY  09/28/13   hiatus hernia, la grade a reflux esophagitis, normal stomach, normal duodenum    Family History  Problem Relation Age of Onset  . Cervical cancer Mother   . Uterine cancer Mother   . Lung cancer Maternal Grandfather   . Heart disease Father   . Heart attack  Father   . Stroke Paternal Grandmother   . Alzheimer's disease Paternal Grandmother   . Heart attack Paternal Grandfather   . Breast cancer Maternal Aunt     Social History Social History   Tobacco Use  . Smoking status: Never Smoker  . Smokeless tobacco: Never Used  Substance Use Topics  . Alcohol use: Yes  . Drug use: No    Allergies  Allergen Reactions  . Penicillins Hives  . Sulfur Hives    Current Outpatient Medications  Medication Sig Dispense Refill  . aspirin EC 81 MG tablet Take by mouth.    Marland Kitchen BIOTIN PO Take by mouth.    . Calcium-Vitamin D-Vitamin K (VIACTIV PO) Take by mouth.    . ibandronate (BONIVA) 150 MG tablet TAKE 1 TABLET BY MOUTH EVERY 30 DAYS 1 tablet 11  . letrozole (FEMARA) 2.5 MG tablet TAKE 1 TABLET (2.5 MG TOTAL) BY MOUTH DAILY. 90 tablet 3  . LORazepam (ATIVAN) 1 MG tablet TAKE 1/2 TO 1 TABLET BY MOUTH AT BEDTIME AS NEEDED 30 tablet 5  . Multiple Vitamins-Minerals (CENTRUM SILVER PO) Take by mouth.    Marland Kitchen omeprazole (PRILOSEC) 20 MG capsule Take 1 capsule by mouth daily.    . valACYclovir (VALTREX) 1000 MG tablet TAKE 2 TABLETS (2,000 MG TOTAL) BY MOUTH 2 (TWO) TIMES DAILY. AS NEEDED 16 tablet 5   No current facility-administered medications for this visit.     Review  of Systems Review of Systems  Constitutional: Negative.   Respiratory: Negative.   Cardiovascular: Negative.     Blood pressure 120/84, pulse 96, temperature 97.7 F (36.5 C), temperature source Temporal, resp. rate 12, height 5' (1.524 m), weight 133 lb (60.3 kg), SpO2 98 %.  Physical Exam Physical Exam Exam conducted with a chaperone present.  Constitutional:      Appearance: She is well-developed.  Eyes:     General: No scleral icterus.    Conjunctiva/sclera: Conjunctivae normal.  Neck:     Musculoskeletal: Neck supple.  Cardiovascular:     Rate and Rhythm: Normal rate and regular rhythm.     Heart sounds: Normal heart sounds.  Pulmonary:     Effort: Pulmonary  effort is normal.     Breath sounds: Normal breath sounds.  Chest:     Breasts:        Right: No inverted nipple, mass, nipple discharge, skin change or tenderness.        Left: No inverted nipple, mass, nipple discharge, skin change or tenderness.       Comments: Left breast wide excision Lymphadenopathy:     Cervical: No cervical adenopathy.     Upper Body:     Right upper body: No supraclavicular or axillary adenopathy.     Left upper body: No supraclavicular or axillary adenopathy.  Skin:    General: Skin is warm and dry.  Neurological:     Mental Status: She is alert and oriented to person, place, and time.  Psychiatric:        Behavior: Behavior normal.     Data Reviewed Bilateral diagnostic mammograms dated Sep 19, 2018 were reviewed.  Postsurgical changes.  BI-RADS-2.  Bone density exam dated July 12, 2017 showed osteopenia in the spine, normal femur.  No interval change from 2018, improvement from 2017.  Assessment Doing well now 5 years status post treatment of left breast cancer with neoadjuvant chemotherapy.  Plan  The patient's original tumor was 5% ER/PR positive.  Recent standards have shown this to be of intermediate value for antiestrogen therapy.  I think that it is unlikely she would benefit from extended antiestrogen therapy having completed 5 years of Femara.  The patient will complete her present supply of Femara and then discontinue.  The patient is aware to call back for any questions or new concerns.  Patient will be asked to return to the office in one year with a bilateral screening mammogram.   HPI, assessment, plan and physical exam has been scribed under the direction and in the presence of Robert Bellow, MD. Karie Fetch, RN  I have completed the exam and reviewed the above documentation for accuracy and completeness.  I agree with the above.  Haematologist has been used and any errors in dictation or transcription are  unintentional.  Hervey Ard, M.D., F.A.C.S.   Dawn Rivas 07/22/2018, 7:53 AM

## 2018-07-21 NOTE — Patient Instructions (Signed)
Patient will be asked to return to the office in one year with a bilateral screening mammogram. The patient is aware to call back for any questions or new concerns.

## 2018-07-22 ENCOUNTER — Encounter: Payer: Self-pay | Admitting: General Surgery

## 2018-07-22 ENCOUNTER — Encounter: Payer: Self-pay | Admitting: Physician Assistant

## 2018-07-22 ENCOUNTER — Encounter: Payer: BLUE CROSS/BLUE SHIELD | Admitting: Physician Assistant

## 2018-07-22 ENCOUNTER — Ambulatory Visit (INDEPENDENT_AMBULATORY_CARE_PROVIDER_SITE_OTHER): Payer: BLUE CROSS/BLUE SHIELD | Admitting: Physician Assistant

## 2018-07-22 VITALS — BP 114/72 | HR 83 | Temp 98.2°F | Resp 16 | Ht 59.0 in | Wt 131.0 lb

## 2018-07-22 DIAGNOSIS — R7309 Other abnormal glucose: Secondary | ICD-10-CM

## 2018-07-22 DIAGNOSIS — Z0000 Encounter for general adult medical examination without abnormal findings: Secondary | ICD-10-CM

## 2018-07-22 DIAGNOSIS — E7800 Pure hypercholesterolemia, unspecified: Secondary | ICD-10-CM

## 2018-07-22 DIAGNOSIS — Z6826 Body mass index (BMI) 26.0-26.9, adult: Secondary | ICD-10-CM

## 2018-07-22 NOTE — Patient Instructions (Signed)

## 2018-07-22 NOTE — Progress Notes (Signed)
Patient: Dawn Rivas, Female    DOB: [birth year], 53 y.o.   MRN: [id number] Visit Date: 07/22/2018  Today's Provider: Mar Daring, PA-C   Chief Complaint  Patient presents with  . Annual Exam   Subjective:     Annual physical exam Dawn Rivas is a 53 y.o. female who presents today for health maintenance and complete physical. She feels well. She reports exercising daily. She reports she is sleeping well.  07/19/17 CPE 07/16/16 Pap/HPV-negative 07/20/18 Mammogram-BI-RADS 1; history of breast cancer, followed by Dr. Bary Castilla. 08/01/14 Colonoscopy-normal -----------------------------------------------------------------   Review of Systems  Constitutional: Negative.   HENT: Negative.   Eyes: Negative.   Respiratory: Negative.   Cardiovascular: Negative.   Gastrointestinal: Negative.   Endocrine: Negative.   Genitourinary: Negative.   Musculoskeletal: Negative.   Skin: Negative.   Allergic/Immunologic: Negative.   Neurological: Negative.   Hematological: Negative.   Psychiatric/Behavioral: Negative.     Social History      She  reports that she has never smoked. She has never used smokeless tobacco. She reports current alcohol use. She reports that she does not use drugs.       Social History   Socioeconomic History  . Marital status: Married    Spouse name: Not on file  . Number of children: 3  . Years of education: Not on file  . Highest education level: Not on file  Occupational History    Employer: [employer]  Social Needs  . Financial resource strain: Not on file  . Food insecurity:    Worry: Not on file    Inability: Not on file  . Transportation needs:    Medical: Not on file    Non-medical: Not on file  Tobacco Use  . Smoking status: Never Smoker  . Smokeless tobacco: Never Used  Substance and Sexual Activity  . Alcohol use: Yes  . Drug use: No  . Sexual activity: Not on file  Lifestyle  . Physical activity:    Days per  week: Not on file    Minutes per session: Not on file  . Stress: Not on file  Relationships  . Social connections:    Talks on phone: Not on file    Gets together: Not on file    Attends religious service: Not on file    Active member of club or organization: Not on file    Attends meetings of clubs or organizations: Not on file    Relationship status: Not on file  Other Topics Concern  . Not on file  Social History Narrative  . Not on file    Past Medical History:  Diagnosis Date  . Breast cancer ([place]) 07/10/2013   T2, N1, ER/PR less than 5%, HER-2 negative.  Neoadjuvant chemotherapy. yPT1a,N0 post neoadjuvant treatment.  . Cancer ([place]) 2015   Invasive mammary carcinoma left breast, 2 microscopic foci residual tumor: 3.5 mm and one 0.0 mm respectively. Sentinel nodes negative x3. Closest margin 4 mm. Definitive affective neoadjuvant chemotherapy identified both of the breast sentinel lymph node. YyPT1a, N0.  ER 5%, PR 5%, HER-2/neu is not amplified.  . Personal history of chemotherapy   . Personal history of radiation therapy      Patient Active Problem List   Diagnosis Date Noted  . Hematuria 07/16/2015  . Abnormal blood sugar 07/16/2015  . High risk medication use 07/16/2015  . Osteoporosis 07/16/2015  . GERD (gastroesophageal reflux disease) 07/16/2015  . Absence of  menstruation 06/19/2015  . BMI 26.0-26.9,adult 06/19/2015  . Breast carcinoma ([place]) 06/19/2015  . Cold sore 06/19/2015  . Calcium blood increased 06/19/2015  . Cannot sleep 06/19/2015  . Irregular bleeding 06/19/2015  . Hypercholesterolemia without hypertriglyceridemia 06/19/2015  . Allergic rhinitis, seasonal 06/19/2015  . Anxiety 02/18/2015  . Encounter for screening colonoscopy 07/10/2014  . History of breast cancer 07/17/2013    Past Surgical History:  Procedure Laterality Date  . AUGMENTATION MAMMAPLASTY Bilateral    breast implants  . BREAST BIOPSY Left 2015   Left breast, 2 microscopic foci  residual tumor: 3.5 mm and one 0.0 mm respectively. Sentinel nodes negative x3. Closest margin 4 mm. Definitive affective neoadjuvant chemotherapy identified both of the breast sentinel lymph node. YyPT1a, N0.  ER 5%, PR 5%, HER-2/neu is not amplified.  Marland Kitchen BREAST ENHANCEMENT SURGERY  7 years ago  . BREAST LUMPECTOMY Left 01/05/2014   Wide excision, mastoplasty, sentinel node biopsy. F/U Radiation and Chemo  . COLONOSCOPY  08/01/2014   Normal, repeat 2026.  Marland Kitchen PORTACATH PLACEMENT  07/24/13  . TUBAL LIGATION  1997  . UPPER GI ENDOSCOPY  09/28/13   hiatus hernia, la grade a reflux esophagitis, normal stomach, normal duodenum    Family History        Family Status  Relation Name Status  . Mother  Alive  . MGF  Deceased at age 70  . Father  Alive  . Sister  Alive  . Brother  Alive  . Son  Alive  . MGM  Alive  . PGM  Deceased at age 98  . PGF  Deceased at age 51  . Son  Alive  . Son  Alive  . Mat Aunt  (Not Specified)        Her family history includes Alzheimer's disease in her paternal grandmother; Breast cancer in her maternal aunt; Cervical cancer in her mother; Heart attack in her father and paternal grandfather; Heart disease in her father; Lung cancer in her maternal grandfather; Stroke in her paternal grandmother; Uterine cancer in her mother.      Allergies  Allergen Reactions  . Penicillins Hives  . Sulfur Hives     Current Outpatient Medications:  .  aspirin EC 81 MG tablet, Take by mouth., Disp: , Rfl:  .  BIOTIN PO, Take by mouth., Disp: , Rfl:  .  Calcium-Vitamin D-Vitamin K (VIACTIV PO), Take by mouth., Disp: , Rfl:  .  ibandronate (BONIVA) 150 MG tablet, TAKE 1 TABLET BY MOUTH EVERY 30 DAYS, Disp: 1 tablet, Rfl: 11 .  letrozole (FEMARA) 2.5 MG tablet, TAKE 1 TABLET (2.5 MG TOTAL) BY MOUTH DAILY., Disp: 90 tablet, Rfl: 3 .  LORazepam (ATIVAN) 1 MG tablet, TAKE 1/2 TO 1 TABLET BY MOUTH AT BEDTIME AS NEEDED, Disp: 30 tablet, Rfl: 5 .  Multiple Vitamins-Minerals  (CENTRUM SILVER PO), Take by mouth., Disp: , Rfl:  .  omeprazole (PRILOSEC) 20 MG capsule, Take 1 capsule by mouth daily., Disp: , Rfl:  .  valACYclovir (VALTREX) 1000 MG tablet, TAKE 2 TABLETS (2,000 MG TOTAL) BY MOUTH 2 (TWO) TIMES DAILY. AS NEEDED, Disp: 16 tablet, Rfl: 5   Patient Care Team: Mar Daring, PA-C as PCP - General (Family Medicine) Bary Castilla, Forest Gleason, MD (General Surgery) Lloyd Huger, MD as Consulting Physician (Oncology)    Objective:    Vitals: BP 114/72 (BP Location: Right Arm, Patient Position: Sitting, Cuff Size: Large)   Pulse 83   Temp 98.2 F (36.8 C) (Oral)  Resp 16   Ht '4\' 11"'  (1.499 m)   Wt 131 lb (59.4 kg)   SpO2 98%   BMI 26.46 kg/m    Vitals:   07/22/18 0901  BP: 114/72  Pulse: 83  Resp: 16  Temp: 98.2 F (36.8 C)  TempSrc: Oral  SpO2: 98%  Weight: 131 lb (59.4 kg)  Height: '4\' 11"'  (1.499 m)     Physical Exam Vitals signs reviewed.  Constitutional:      General: She is not in acute distress.    Appearance: Normal appearance. She is well-developed. She is not ill-appearing or diaphoretic.  HENT:     Head: Normocephalic and atraumatic.     Right Ear: Tympanic membrane, ear canal and external ear normal.     Left Ear: Tympanic membrane, ear canal and external ear normal.     Nose: Nose normal.     Mouth/Throat:     Mouth: Mucous membranes are moist.     Pharynx: No oropharyngeal exudate or posterior oropharyngeal erythema.  Eyes:     General: No scleral icterus.       Right eye: No discharge.        Left eye: No discharge.     Conjunctiva/sclera: Conjunctivae normal.     Pupils: Pupils are equal, round, and reactive to light.  Neck:     Musculoskeletal: Normal range of motion and neck supple.     Thyroid: No thyromegaly.     Vascular: No JVD.     Trachea: No tracheal deviation.  Cardiovascular:     Rate and Rhythm: Normal rate and regular rhythm.     Heart sounds: Normal heart sounds. No murmur. No friction rub.  No gallop.   Pulmonary:     Effort: Pulmonary effort is normal. No respiratory distress.     Breath sounds: Normal breath sounds. No wheezing or rales.  Chest:     Chest wall: No tenderness.  Abdominal:     General: Abdomen is flat. Bowel sounds are normal. There is no distension.     Palpations: Abdomen is soft. There is no mass.     Tenderness: There is no abdominal tenderness. There is no guarding or rebound.  Musculoskeletal: Normal range of motion.        General: No tenderness.  Lymphadenopathy:     Cervical: No cervical adenopathy.  Skin:    General: Skin is warm and dry.     Capillary Refill: Capillary refill takes less than 2 seconds.     Findings: No rash.  Neurological:     Mental Status: She is alert and oriented to person, place, and time.  Psychiatric:        Behavior: Behavior normal.        Thought Content: Thought content normal.        Judgment: Judgment normal.      Depression Screen PHQ 2/9 Scores 07/22/2018 07/19/2017 07/16/2016  PHQ - 2 Score 0 0 0  PHQ- 9 Score - - 0       Assessment & Plan:     Routine Health Maintenance and Physical Exam  Exercise Activities and Dietary recommendations Goals   None     Immunization History  Administered Date(s) Administered  . Tdap 01/07/2012  . Zoster Recombinat (Shingrix) 09/25/2017, 12/10/2017    Health Maintenance  Topic Date Due  . INFLUENZA VACCINE  08/09/2018 (Originally 12/09/2017)  . PAP SMEAR-Modifier  07/17/2019  . MAMMOGRAM  07/19/2020  . TETANUS/TDAP  01/06/2022  . COLONOSCOPY  07/31/2024  . Hepatitis C Screening  Completed  . HIV Screening  Completed     Discussed health benefits of physical activity, and encouraged her to engage in regular exercise appropriate for her age and condition.    1. Annual physical exam Normal physical exam today. Will check labs as below and f/u pending lab results. If labs are stable and WNL she will not need to have these rechecked for one year at her  next annual physical exam. She is to call the office in the meantime if she has any acute issue, questions or concerns. - CBC with Differential/Platelet - Comprehensive metabolic panel - Lipid Panel With LDL/HDL Ratio - TSH - Hemoglobin A1c  2. Abnormal blood sugar Diet controlled. Will check labs as below and f/u pending results. - Comprehensive metabolic panel - Hemoglobin A1c  3. Hypercholesterolemia without hypertriglyceridemia Diet controlled. Will check labs as below and f/u pending results. - Comprehensive metabolic panel - Lipid Panel With LDL/HDL Ratio  4. BMI 26.0-26.9,adult Counseled patient on healthy lifestyle modifications including dieting and exercise.  - Comprehensive metabolic panel - Lipid Panel With LDL/HDL Ratio - Hemoglobin A1c  --------------------------------------------------------------------    Mar Daring, PA-C  [facility]

## 2018-07-23 LAB — CBC WITH DIFFERENTIAL/PLATELET
BASOS: 1 %
Basophils Absolute: 0 10*3/uL (ref 0.0–0.2)
EOS (ABSOLUTE): 0.1 10*3/uL (ref 0.0–0.4)
Eos: 2 %
HEMOGLOBIN: 14.6 g/dL (ref 11.1–15.9)
Hematocrit: 41.9 % (ref 34.0–46.6)
Immature Grans (Abs): 0 10*3/uL (ref 0.0–0.1)
Immature Granulocytes: 0 %
Lymphocytes Absolute: 1.7 10*3/uL (ref 0.7–3.1)
Lymphs: 39 %
MCH: 32.7 pg (ref 26.6–33.0)
MCHC: 34.8 g/dL (ref 31.5–35.7)
MCV: 94 fL (ref 79–97)
Monocytes Absolute: 0.3 10*3/uL (ref 0.1–0.9)
Monocytes: 8 %
Neutrophils Absolute: 2.2 10*3/uL (ref 1.4–7.0)
Neutrophils: 50 %
Platelets: 204 10*3/uL (ref 150–450)
RBC: 4.47 x10E6/uL (ref 3.77–5.28)
RDW: 12.1 % (ref 11.7–15.4)
WBC: 4.3 10*3/uL (ref 3.4–10.8)

## 2018-07-23 LAB — COMPREHENSIVE METABOLIC PANEL
ALBUMIN: 4.7 g/dL (ref 3.8–4.9)
ALT: 21 IU/L (ref 0–32)
AST: 23 IU/L (ref 0–40)
Albumin/Globulin Ratio: 2 (ref 1.2–2.2)
Alkaline Phosphatase: 33 IU/L — ABNORMAL LOW (ref 39–117)
BUN/Creatinine Ratio: 19 (ref 9–23)
BUN: 16 mg/dL (ref 6–24)
Bilirubin Total: 0.5 mg/dL (ref 0.0–1.2)
CO2: 24 mmol/L (ref 20–29)
Calcium: 9.6 mg/dL (ref 8.7–10.2)
Chloride: 101 mmol/L (ref 96–106)
Creatinine, Ser: 0.84 mg/dL (ref 0.57–1.00)
GFR calc Af Amer: 91 mL/min/{1.73_m2} (ref >59)
GFR calc non Af Amer: 79 mL/min/{1.73_m2} (ref >59)
Globulin, Total: 2.4 g/dL (ref 1.5–4.5)
Glucose: 87 mg/dL (ref 65–99)
Potassium: 4.5 mmol/L (ref 3.5–5.2)
Sodium: 140 mmol/L (ref 134–144)
Total Protein: 7.1 g/dL (ref 6.0–8.5)

## 2018-07-23 LAB — TSH: TSH: 2.93 u[IU]/mL (ref 0.450–4.500)

## 2018-07-23 LAB — HEMOGLOBIN A1C
Est. average glucose Bld gHb Est-mCnc: 100 mg/dL
Hgb A1c MFr Bld: 5.1 % (ref 4.8–5.6)

## 2018-07-23 LAB — LIPID PANEL WITH LDL/HDL RATIO
Cholesterol, Total: 263 mg/dL — ABNORMAL HIGH (ref 100–199)
HDL: 63 mg/dL (ref >39)
LDL Calculated: 177 mg/dL — ABNORMAL HIGH (ref 0–99)
LDl/HDL Ratio: 2.8 ratio (ref 0.0–3.2)
TRIGLYCERIDES: 115 mg/dL (ref 0–149)
VLDL Cholesterol Cal: 23 mg/dL (ref 5–40)

## 2018-07-25 ENCOUNTER — Telehealth: Payer: Self-pay

## 2018-07-25 NOTE — Telephone Encounter (Signed)
-----   Message from Mar Daring, [place] sent at 07/25/2018  8:16 AM EDT ----- Blood count is normal. Kidney and liver function are normal. Sodium and potassium normal. Cholesterol is up from last year but I know we did this nonfasting which also will make it higher. Thyroid is normal. Sugar is normal.

## 2018-07-25 NOTE — Telephone Encounter (Signed)
Patient advised as below.  

## 2018-07-26 ENCOUNTER — Other Ambulatory Visit: Payer: Self-pay | Admitting: Physician Assistant

## 2018-07-26 DIAGNOSIS — F419 Anxiety disorder, unspecified: Secondary | ICD-10-CM

## 2018-12-08 ENCOUNTER — Encounter: Payer: Self-pay | Admitting: General Surgery

## 2019-03-01 ENCOUNTER — Other Ambulatory Visit: Payer: Self-pay | Admitting: Orthopedic Surgery

## 2019-03-01 DIAGNOSIS — M25562 Pain in left knee: Secondary | ICD-10-CM

## 2019-03-02 ENCOUNTER — Other Ambulatory Visit: Payer: Self-pay | Admitting: Physician Assistant

## 2019-03-02 DIAGNOSIS — F419 Anxiety disorder, unspecified: Secondary | ICD-10-CM

## 2019-03-13 ENCOUNTER — Other Ambulatory Visit: Payer: Self-pay

## 2019-03-13 ENCOUNTER — Ambulatory Visit
Admission: RE | Admit: 2019-03-13 | Discharge: 2019-03-13 | Disposition: A | Discharge status: Home / Self Care | Payer: BC Managed Care – PPO | Source: Ambulatory Visit | Attending: Orthopedic Surgery | Admitting: Orthopedic Surgery

## 2019-03-13 DIAGNOSIS — M25562 Pain in left knee: Secondary | ICD-10-CM | POA: Insufficient documentation

## 2019-03-27 ENCOUNTER — Other Ambulatory Visit: Payer: Self-pay | Admitting: Surgery

## 2019-03-29 ENCOUNTER — Other Ambulatory Visit: Payer: Self-pay

## 2019-03-29 ENCOUNTER — Encounter: Payer: Self-pay | Admitting: *Deleted

## 2019-03-31 ENCOUNTER — Other Ambulatory Visit
Admission: RE | Admit: 2019-03-31 | Discharge: 2019-03-31 | Disposition: A | Discharge status: Home / Self Care | Payer: BC Managed Care – PPO | Source: Ambulatory Visit | Attending: Surgery | Admitting: Surgery

## 2019-03-31 ENCOUNTER — Other Ambulatory Visit: Payer: Self-pay

## 2019-03-31 DIAGNOSIS — Z20828 Contact with and (suspected) exposure to other viral communicable diseases: Secondary | ICD-10-CM | POA: Diagnosis not present

## 2019-03-31 DIAGNOSIS — Z01812 Encounter for preprocedural laboratory examination: Secondary | ICD-10-CM | POA: Diagnosis not present

## 2019-04-01 LAB — SARS CORONAVIRUS 2 (TAT 6-24 HRS): SARS Coronavirus 2: NEGATIVE

## 2019-04-03 ENCOUNTER — Telehealth: Payer: Self-pay | Admitting: Physician Assistant

## 2019-04-03 DIAGNOSIS — K219 Gastro-esophageal reflux disease without esophagitis: Secondary | ICD-10-CM

## 2019-04-03 MED ORDER — OMEPRAZOLE 20 MG PO CPDR: 20.0000 mg | DELAYED_RELEASE_CAPSULE | Freq: Every day | ORAL | 1 refills | Status: DC

## 2019-04-03 NOTE — Telephone Encounter (Signed)
I only found one by Dr. Venia Minks 07/2014.

## 2019-04-03 NOTE — Telephone Encounter (Signed)
Patient has been taking daily OTC and now has FSA that helps pay if prescription sent in

## 2019-04-03 NOTE — Telephone Encounter (Signed)
[facility] faxed refill request for the following medications:  omeprazole (PRILOSEC) 20 MG capsule  I don't see that anyone at [facility] has prescribed this medication for pt in the past. LOV: 07/22/2018 Please advise. Thanks TNP

## 2019-04-05 ENCOUNTER — Ambulatory Visit: Payer: BC Managed Care – PPO | Admitting: Anesthesiology

## 2019-04-05 ENCOUNTER — Encounter
Admission: RE | Disposition: A | Discharge status: Home / Self Care | Payer: Self-pay | Source: Home / Self Care | Attending: Surgery

## 2019-04-05 ENCOUNTER — Ambulatory Visit
Admission: RE | Admit: 2019-04-05 | Discharge: 2019-04-05 | Disposition: A | Discharge status: Home / Self Care | Payer: BC Managed Care – PPO | Attending: Surgery | Admitting: Surgery

## 2019-04-05 ENCOUNTER — Other Ambulatory Visit: Payer: Self-pay

## 2019-04-05 DIAGNOSIS — Z880 Allergy status to penicillin: Secondary | ICD-10-CM | POA: Insufficient documentation

## 2019-04-05 DIAGNOSIS — F419 Anxiety disorder, unspecified: Secondary | ICD-10-CM | POA: Diagnosis not present

## 2019-04-05 DIAGNOSIS — Z7982 Long term (current) use of aspirin: Secondary | ICD-10-CM | POA: Diagnosis not present

## 2019-04-05 DIAGNOSIS — M1712 Unilateral primary osteoarthritis, left knee: Secondary | ICD-10-CM | POA: Diagnosis not present

## 2019-04-05 DIAGNOSIS — K219 Gastro-esophageal reflux disease without esophagitis: Secondary | ICD-10-CM | POA: Diagnosis not present

## 2019-04-05 DIAGNOSIS — M2242 Chondromalacia patellae, left knee: Secondary | ICD-10-CM | POA: Insufficient documentation

## 2019-04-05 DIAGNOSIS — Z7983 Long term (current) use of bisphosphonates: Secondary | ICD-10-CM | POA: Insufficient documentation

## 2019-04-05 DIAGNOSIS — Z853 Personal history of malignant neoplasm of breast: Secondary | ICD-10-CM | POA: Insufficient documentation

## 2019-04-05 DIAGNOSIS — S83232A Complex tear of medial meniscus, current injury, left knee, initial encounter: Secondary | ICD-10-CM | POA: Diagnosis not present

## 2019-04-05 DIAGNOSIS — X58XXXA Exposure to other specified factors, initial encounter: Secondary | ICD-10-CM | POA: Insufficient documentation

## 2019-04-05 DIAGNOSIS — Z79899 Other long term (current) drug therapy: Secondary | ICD-10-CM | POA: Insufficient documentation

## 2019-04-05 DIAGNOSIS — S83242A Other tear of medial meniscus, current injury, left knee, initial encounter: Secondary | ICD-10-CM | POA: Diagnosis present

## 2019-04-05 DIAGNOSIS — M6752 Plica syndrome, left knee: Secondary | ICD-10-CM | POA: Diagnosis not present

## 2019-04-05 DIAGNOSIS — Z882 Allergy status to sulfonamides status: Secondary | ICD-10-CM | POA: Diagnosis not present

## 2019-04-05 HISTORY — PX: KNEE ARTHROSCOPY WITH MEDIAL MENISECTOMY: SHX5651

## 2019-04-05 SURGERY — ARTHROSCOPY, KNEE, WITH MEDIAL MENISCECTOMY
Anesthesia: General | Site: Knee | Laterality: Left

## 2019-04-05 MED ORDER — METOCLOPRAMIDE HCL 5 MG PO TABS: 5.0000 mg | ORAL_TABLET | Freq: Three times a day (TID) | ORAL | Status: DC | PRN

## 2019-04-05 MED ORDER — POTASSIUM CHLORIDE IN NACL 20-0.9 MEQ/L-% IV SOLN: INTRAVENOUS | Status: DC

## 2019-04-05 MED ORDER — CLINDAMYCIN PHOSPHATE 900 MG/50ML IV SOLN: 900.0000 mg | INTRAVENOUS | Status: DC

## 2019-04-05 MED ORDER — CHLORHEXIDINE GLUCONATE 4 % EX LIQD
60.0000 mL | Freq: Once | CUTANEOUS | Status: AC
  Administered 2019-04-05: 4 via TOPICAL

## 2019-04-05 MED ORDER — LIDOCAINE-EPINEPHRINE 1 %-1:100000 IJ SOLN
INTRAMUSCULAR | Status: DC | PRN
  Administered 2019-04-05: 30 mL

## 2019-04-05 MED ORDER — DEXAMETHASONE SODIUM PHOSPHATE 4 MG/ML IJ SOLN
INTRAMUSCULAR | Status: DC | PRN
  Administered 2019-04-05: 4 mg via INTRAVENOUS

## 2019-04-05 MED ORDER — HYDROCODONE-ACETAMINOPHEN 5-325 MG PO TABS: 1.0000 | ORAL_TABLET | ORAL | 0 refills | Status: AC | PRN

## 2019-04-05 MED ORDER — LACTATED RINGERS IV SOLN
INTRAVENOUS | Status: DC
  Administered 2019-04-05: 13:00:00 via INTRAVENOUS

## 2019-04-05 MED ORDER — ONDANSETRON HCL 4 MG PO TABS: 4.0000 mg | ORAL_TABLET | Freq: Four times a day (QID) | ORAL | Status: DC | PRN

## 2019-04-05 MED ORDER — GLYCOPYRROLATE 0.2 MG/ML IJ SOLN
INTRAMUSCULAR | Status: DC | PRN
  Administered 2019-04-05: 0.1 mg via INTRAVENOUS

## 2019-04-05 MED ORDER — KETOROLAC TROMETHAMINE 30 MG/ML IJ SOLN: 30.0000 mg | Freq: Once | INTRAMUSCULAR | Status: AC | PRN

## 2019-04-05 MED ORDER — HYDROCODONE-ACETAMINOPHEN 7.5-325 MG PO TABS: 1.0000 | ORAL_TABLET | Freq: Once | ORAL | Status: DC | PRN

## 2019-04-05 MED ORDER — LIDOCAINE HCL (CARDIAC) PF 100 MG/5ML IV SOSY
PREFILLED_SYRINGE | INTRAVENOUS | Status: DC | PRN
  Administered 2019-04-05: 40 mg via INTRATRACHEAL

## 2019-04-05 MED ORDER — HYDROCODONE-ACETAMINOPHEN 5-325 MG PO TABS: 1.0000 | ORAL_TABLET | ORAL | Status: DC | PRN

## 2019-04-05 MED ORDER — ONDANSETRON HCL 4 MG/2ML IJ SOLN
INTRAMUSCULAR | Status: DC | PRN
  Administered 2019-04-05: 4 mg via INTRAVENOUS

## 2019-04-05 MED ORDER — FENTANYL CITRATE (PF) 100 MCG/2ML IJ SOLN
INTRAMUSCULAR | Status: DC | PRN
  Administered 2019-04-05: 25 ug via INTRAVENOUS
  Administered 2019-04-05: 50 ug via INTRAVENOUS
  Administered 2019-04-05 (×2): 25 ug via INTRAVENOUS

## 2019-04-05 MED ORDER — FENTANYL CITRATE (PF) 100 MCG/2ML IJ SOLN: 25.0000 ug | INTRAMUSCULAR | Status: DC | PRN

## 2019-04-05 MED ORDER — OXYCODONE HCL 5 MG/5ML PO SOLN: 5.0000 mg | Freq: Once | ORAL | Status: AC | PRN

## 2019-04-05 MED ORDER — ONDANSETRON HCL 4 MG/2ML IJ SOLN: 4.0000 mg | Freq: Once | INTRAMUSCULAR | Status: DC | PRN

## 2019-04-05 MED ORDER — BUPIVACAINE-EPINEPHRINE (PF) 0.5% -1:200000 IJ SOLN
INTRAMUSCULAR | Status: DC | PRN
  Administered 2019-04-05: 20 mL

## 2019-04-05 MED ORDER — LACTATED RINGERS IV SOLN: INTRAVENOUS | Status: DC

## 2019-04-05 MED ORDER — OXYCODONE HCL 5 MG PO TABS
5.0000 mg | ORAL_TABLET | Freq: Once | ORAL | Status: AC | PRN
  Administered 2019-04-05: 5 mg via ORAL

## 2019-04-05 MED ORDER — METOCLOPRAMIDE HCL 5 MG/ML IJ SOLN: 5.0000 mg | Freq: Three times a day (TID) | INTRAMUSCULAR | Status: DC | PRN

## 2019-04-05 MED ORDER — BUPIVACAINE HCL (PF) 0.5 % IJ SOLN
INTRAMUSCULAR | Status: DC | PRN
  Administered 2019-04-05: 30 mL

## 2019-04-05 MED ORDER — PROPOFOL 10 MG/ML IV BOLUS
INTRAVENOUS | Status: DC | PRN
  Administered 2019-04-05: 20 mg via INTRAVENOUS
  Administered 2019-04-05: 120 mg via INTRAVENOUS

## 2019-04-05 MED ORDER — ONDANSETRON HCL 4 MG/2ML IJ SOLN: 4.0000 mg | Freq: Four times a day (QID) | INTRAMUSCULAR | Status: DC | PRN

## 2019-04-05 MED ORDER — MIDAZOLAM HCL 5 MG/5ML IJ SOLN
INTRAMUSCULAR | Status: DC | PRN
  Administered 2019-04-05: 2 mg via INTRAVENOUS

## 2019-04-05 SURGICAL SUPPLY — 55 items
ADAPTER IRRIG TUBE 2 SPIKE SOL (ADAPTER) ×6 IMPLANT
ANCHOR 4.5 FOOTPRINT ULTRA (Anchor) ×3 IMPLANT
BIT DRILL 4X4.5 FOOTPRINT STR (BIT) ×1 IMPLANT
BLADE FULL RADIUS 3.5 (BLADE) ×3 IMPLANT
BLADE SURG SZ11 CARB STEEL (BLADE) ×3 IMPLANT
BNDG ELASTIC 6X5.8 VLCR NS LF (GAUZE/BANDAGES/DRESSINGS) ×3 IMPLANT
BNDG ESMARK 6X12 TAN STRL LF (GAUZE/BANDAGES/DRESSINGS) ×3 IMPLANT
BRACE KNEE POST OP SHORT (BRACE) ×3 IMPLANT
BUR ACROMIONIZER 4.0 (BURR) IMPLANT
CARTRIDGE REPAIR MENISCAL SZ 0 (Anchor) IMPLANT
CHLORAPREP W/TINT 26 (MISCELLANEOUS) ×3 IMPLANT
COOLER POLAR GLACIER W/PUMP (MISCELLANEOUS) IMPLANT
COVER LIGHT HANDLE UNIVERSAL (MISCELLANEOUS) ×6 IMPLANT
CUFF TOURN SGL QUICK 30 (TOURNIQUET CUFF) ×2
CUFF TOURN SGL QUICK 34 (TOURNIQUET CUFF)
CUFF TRNQT CYL 30X4X21-28X (TOURNIQUET CUFF) ×1 IMPLANT
CUFF TRNQT CYL 34X4.125X (TOURNIQUET CUFF) IMPLANT
DRAPE IMP U-DRAPE 54X76 (DRAPES) ×3 IMPLANT
DRAPE U-SHAPE 48X52 POLY STRL (PACKS) ×3 IMPLANT
DRILL 4X4.5 FOOTPRINT STR (BIT) ×3
ELECT REM PT RETURN 9FT ADLT (ELECTROSURGICAL) ×3
ELECTRODE REM PT RTRN 9FT ADLT (ELECTROSURGICAL) ×1 IMPLANT
GAUZE SPONGE 4X4 12PLY STRL (GAUZE/BANDAGES/DRESSINGS) ×3 IMPLANT
GLOVE BIO SURGEON STRL SZ8 (GLOVE) ×6 IMPLANT
GLOVE INDICATOR 8.0 STRL GRN (GLOVE) ×6 IMPLANT
GOWN STRL REUS W/ TWL LRG LVL3 (GOWN DISPOSABLE) ×1 IMPLANT
GOWN STRL REUS W/ TWL XL LVL3 (GOWN DISPOSABLE) ×1 IMPLANT
GOWN STRL REUS W/TWL LRG LVL3 (GOWN DISPOSABLE) ×2
GOWN STRL REUS W/TWL XL LVL3 (GOWN DISPOSABLE) ×2
IV LACTATED RINGER IRRG 3000ML (IV SOLUTION) ×8
IV LR IRRIG 3000ML ARTHROMATIC (IV SOLUTION) ×4 IMPLANT
KIT MENISCAL ROOT REPAIR (KITS) ×3 IMPLANT
KIT TURNOVER KIT A (KITS) ×3 IMPLANT
MANIFOLD 4PT FOR NEPTUNE1 (MISCELLANEOUS) ×3 IMPLANT
MAT ABSORB  FLUID 56X50 GRAY (MISCELLANEOUS) ×2
MAT ABSORB FLUID 56X50 GRAY (MISCELLANEOUS) ×1 IMPLANT
MENISCAL REPAIR CARTRIDGE SZ 0 (Anchor) IMPLANT
MENISCAL REPAIR SYSTEM SZ 0 (Anchor) ×3 IMPLANT
NEEDLE HYPO 21X1.5 SAFETY (NEEDLE) ×3 IMPLANT
NEEDLE HYPO 22GX1.5 SAFETY (NEEDLE) ×3 IMPLANT
NS IRRIG 500ML POUR BTL (IV SOLUTION) IMPLANT
PACK ARTHROSCOPY KNEE (MISCELLANEOUS) ×3 IMPLANT
PAD WRAPON POLAR KNEE (MISCELLANEOUS) IMPLANT
PADDING CAST BLEND 6X4 STRL (MISCELLANEOUS) ×1 IMPLANT
PADDING STRL CAST 6IN (MISCELLANEOUS) ×2
PENCIL SMOKE EVAC W/HOLSTER (ELECTROSURGICAL) ×3 IMPLANT
SUT PROLENE 4-0 (SUTURE) ×2
SUT PROLENE 4-0 P-3 18XMFL UD (SUTURE) ×1
SUTURE PROLEN 4-0 P3 18XMFL UD (SUTURE) ×1 IMPLANT
SYR 50ML LL SCALE MARK (SYRINGE) ×3 IMPLANT
SYSTEM REPAIR MENISCAL SZ 0 (Anchor) ×1 IMPLANT
TUBING ARTHRO INFLOW-ONLY STRL (TUBING) ×3 IMPLANT
WAND 30 DEG SABER W/CORD (SURGICAL WAND) ×3 IMPLANT
WAND WEREWOLF FLOW 90D (MISCELLANEOUS) ×3 IMPLANT
WRAPON POLAR PAD KNEE (MISCELLANEOUS)

## 2019-04-05 NOTE — Anesthesia Preprocedure Evaluation (Signed)
Anesthesia Evaluation  Patient identified by MRN, date of birth, ID band Patient awake    Reviewed: NPO status   History of Anesthesia Complications Negative for: history of anesthetic complications  Airway Mallampati: II  TM Distance: >3 FB Neck ROM: full    Dental no notable dental hx.    Pulmonary neg pulmonary ROS,    Pulmonary exam normal        Cardiovascular Exercise Tolerance: Good negative cardio ROS Normal cardiovascular exam     Neuro/Psych Anxiety negative neurological ROS     GI/Hepatic Neg liver ROS, GERD  ,  Endo/Other  negative endocrine ROS  Renal/GU negative Renal ROS  negative genitourinary   Musculoskeletal   Abdominal   Peds  Hematology L br. Ca > lumpectomy   Anesthesia Other Findings Covid: NEG.  Reproductive/Obstetrics                             Anesthesia Physical Anesthesia Plan  ASA: II  Anesthesia Plan: General ETT   Post-op Pain Management:    Induction:   PONV Risk Score and Plan: 3 and Midazolam, Ondansetron and Treatment may vary due to age or medical condition  Airway Management Planned:   Additional Equipment:   Intra-op Plan:   Post-operative Plan:   Informed Consent: I have reviewed the patients History and Physical, chart, labs and discussed the procedure including the risks, benefits and alternatives for the proposed anesthesia with the patient or authorized representative who has indicated his/her understanding and acceptance.       Plan Discussed with: CRNA  Anesthesia Plan Comments:         Anesthesia Quick Evaluation

## 2019-04-05 NOTE — Discharge Instructions (Addendum)
General Anesthesia, Adult, Care After °This sheet gives you information about how to care for yourself after your procedure. Your health care provider may also give you more specific instructions. If you have problems or questions, contact your health care provider. °What can I expect after the procedure? °After the procedure, the following side effects are common: °· Pain or discomfort at the IV site. °· Nausea. °· Vomiting. °· Sore throat. °· Trouble concentrating. °· Feeling cold or chills. °· Weak or tired. °· Sleepiness and fatigue. °· Soreness and body aches. These side effects can affect parts of the body that were not involved in surgery. °Follow these instructions at home: ° °For at least 24 hours after the procedure: °· Have a responsible adult stay with you. It is important to have someone help care for you until you are awake and alert. °· Rest as needed. °· Do not: °? Participate in activities in which you could fall or become injured. °? Drive. °? Use heavy machinery. °? Drink alcohol. °? Take sleeping pills or medicines that cause drowsiness. °? Make important decisions or sign legal documents. °? Take care of children on your own. °Eating and drinking °· Follow any instructions from your health care provider about eating or drinking restrictions. °· When you feel hungry, start by eating small amounts of foods that are soft and easy to digest (bland), such as toast. Gradually return to your regular diet. °· Drink enough fluid to keep your urine pale yellow. °· If you vomit, rehydrate by drinking water, juice, or clear broth. °General instructions °· If you have sleep apnea, surgery and certain medicines can increase your risk for breathing problems. Follow instructions from your health care provider about wearing your sleep device: °? Anytime you are sleeping, including during daytime naps. °? While taking prescription pain medicines, sleeping medicines, or medicines that make you drowsy. °· Return to  your normal activities as told by your health care provider. Ask your health care provider what activities are safe for you. °· Take over-the-counter and prescription medicines only as told by your health care provider. °· If you smoke, do not smoke without supervision. °· Keep all follow-up visits as told by your health care provider. This is important. °Contact a health care provider if: °· You have nausea or vomiting that does not get better with medicine. °· You cannot eat or drink without vomiting. °· You have pain that does not get better with medicine. °· You are unable to pass urine. °· You develop a skin rash. °· You have a fever. °· You have redness around your IV site that gets worse. °Get help right away if: °· You have difficulty breathing. °· You have chest pain. °· You have blood in your urine or stool, or you vomit blood. °Summary °· After the procedure, it is common to have a sore throat or nausea. It is also common to feel tired. °· Have a responsible adult stay with you for the first 24 hours after general anesthesia. It is important to have someone help care for you until you are awake and alert. °· When you feel hungry, start by eating small amounts of foods that are soft and easy to digest (bland), such as toast. Gradually return to your regular diet. °· Drink enough fluid to keep your urine pale yellow. °· Return to your normal activities as told by your health care provider. Ask your health care provider what activities are safe for you. °This information is not   intended to replace advice given to you by your health care provider. Make sure you discuss any questions you have with your health care provider. Document Released: 08/03/2000 Document Revised: 04/30/2017 Document Reviewed: 12/11/2016 Elsevier Patient Education  2020 [place] discharge instructions: Keep dressing dry and intact.  May shower after dressing changed on post-op day #4 (Sunday).  Cover stitches  with Band-Aids after drying off. Apply ice frequently to knee. Take ibuprofen 600 mg TID with meals for 7-10 days, then as necessary. Take hydrocodone as prescribed or ES Tylenol if necessary. No weight-bearing on left leg - use crutches. Keep hinged brace on and locked in extension except may remove for bathing purposes. Follow-up in 10-14 days or as scheduled.

## 2019-04-05 NOTE — Anesthesia Procedure Notes (Signed)
Procedure Name: LMA Insertion Date/Time: 04/05/2019 2:07 PM Performed by: Mayme Genta, CRNA Pre-anesthesia Checklist: Patient identified, Emergency Drugs available, Suction available, Timeout performed and Patient being monitored Patient Re-evaluated:Patient Re-evaluated prior to induction Oxygen Delivery Method: Circle system utilized Preoxygenation: Pre-oxygenation with 100% oxygen Induction Type: IV induction LMA: LMA inserted LMA Size: 3.0 Number of attempts: 1 Placement Confirmation: positive ETCO2 and breath sounds checked- equal and bilateral Tube secured with: Tape

## 2019-04-05 NOTE — Op Note (Signed)
04/05/2019  3:24 PM  Patient:   Dawn Rivas  Pre-Op Diagnosis:   Medial meniscus root tear with early degenerative joint disease, left knee.  Postoperative diagnosis:   Medial meniscus root tear with early degenerative joint disease and symptomatic suprapatellar plica, left knee.  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  Surgeon:   Pascal Lux, M.D.  Assistant:   None  Anesthesia:   General LMA.  Findings:   As above. The lateral meniscus was in satisfactory condition, as were the anterior and posterior cruciate ligaments. There was evidence of focal grade II chondromalacia involving the weightbearing portion of the medial femoral condyle, but the medial tibial plateau was in excellent condition. The articular surfaces of the lateral tibial plateau, lateral femoral condyle, and femoral trochlea all were in satisfactory condition.   Complications:   None.  EBL:   5 cc.  Total fluids:   800 cc of crystalloid.  Tourniquet time:   None  Drains:   None  Closure:   Staples.  Brief clinical note:   The patient is a 53 year old female with a 51-month history of medial sided left knee pain, as well as a painful "popping" beneath her patella. These symptoms have persisted despite medications, activity modification, steroid injections, etc. The patient's history and examination were consistent with a medial meniscus tear as well as a probable symptomatic plica. An MRI scan demonstrated the presence of a medial meniscus root tear. The patient presents at this time for arthroscopy, debridement, repair versus partial medial meniscectomy, and possible lateral release.  Procedure:   The patient was brought into the operating room and lain in the supine position. After  adequate general laryngeal mask anesthesia was obtained, a timeout was performed to verify the appropriate side. The patient's left knee was injected sterilely using a solution of 30 cc of 1% lidocaine and 30 cc of 0.5% Sensorcaine with epinephrine. The left lower extremity was prepped with ChloraPrep solution before being draped sterilely. Preoperative antibiotics were administered. The expected portal sites were injected with 0.5% Sensorcaine with epinephrine before the camera was placed in the anterolateral portal and instrumentation performed through the anteromedial portal. The knee was sequentially examined beginning in the suprapatellar pouch, then progressing to the patellofemoral space, the medial gutter compartment, the notch, and finally the lateral compartment and gutter. The findings were as described above. Abundant reactive synovial tissues anteriorly were debrided using the full-radius resector in order to improve visualization.    This debridement enabled visualization of a thickened area of infrapatellar fat pad that appeared to be getting wedged between the lateral patella and the femoral trochlea, resulting in the painful "popping" the patient was experiencing. This area was debrided thoroughly as well. The camera was repositioned in the anteromedial portal and the full-radius resector introduced through the anterolateral portal. The full-radius resector was used to complete the debridement of the thickened synovial tissue/plica. It also was used to debride the areas of grade III-IV chondromalacia involving the lateral patellar facet.    Assessment of patellar tracking demonstrated evidence consistent with lateral patellar compression syndrome. Therefore, it was elected to proceed with an arthroscopic lateral release. This was performed using the [employer] device. The lateral retinaculum was visualized then released from proximal to distal. A second pass was used to ensure complete  release of the lateral retinaculum.  The camera was repositioned in the anterolateral portal and instrumentation performed through the anteromedial portal. The medial meniscus was carefully probed and demonstrated an unstable partial tear of the meniscal root, extending approximately 60 to 70% through the thickness of the meniscus tissue. This was repaired using the [facility] meniscal root repair system. The end of the tear was freshened with a full-radius resector before the attachment site on the proximal tibia was curetted and debrided with the full-radius resector to expose good bleeding bone. The [facility] guide was positioned in the over-the-top position and, utilizing the 55 degree angle setting, the drill/sleeve combination was drilled up into the proximal tibia through a short anterior incision. Once its position was verified intra-articularly, the central drill was removed, leaving the sleeve in place. A looped passing suture was placed up through the retained sleeve and pulled out through the anteromedial wound. Utilizing the FirstPass suture passer, a single #2 FiberWire suture placed into the meniscal root. Using the passing loop, the FiberWire was drawn down through the drill hole in the proximal tibia and brought out anteriorly.  A single [employer] anchor was placed in the anterior tibial cortex to secure the sutures. The repair was assessed and found to be stable to probing. It also appeared to be stable with range of motion of the knee. The instruments were removed from the joint after suctioning the excess fluid.   The subcutaneous tissues in the anterior wound were reapproximated using 2-0 Vicryl interrupted sutures before the skin was closed using 4-0 Prolene interrupted sutures. The portal sites also were closed using 4-0 Prolene interrupted sutures. A sterile bulky dressing was applied to the knee before the patient was placed into a hinged knee brace with the hinges set  at 0-90, but locked in extension. The patient was then awakened, extubated, and returned to the recovery room in satisfactory condition after tolerating the procedure well.

## 2019-04-05 NOTE — Anesthesia Postprocedure Evaluation (Signed)
Anesthesia Post Note  Patient: KERRIN FRANEY  Procedure(s) Performed: KNEE ARTHROSCOPY WITH DEBRIDEMENT, DECOMPRESSION, REPAIR VERSUS PARTIAL MEDIAL MENISECTOMY, AND POSSIBLE LATERAL RELEASE (Left Knee)     Patient location during evaluation: PACU Anesthesia Type: General Level of consciousness: awake and alert Pain management: pain level controlled Vital Signs Assessment: post-procedure vital signs reviewed and stable Respiratory status: spontaneous breathing, nonlabored ventilation, respiratory function stable and patient connected to nasal cannula oxygen Cardiovascular status: blood pressure returned to baseline and stable Postop Assessment: no apparent nausea or vomiting Anesthetic complications: no    [place]

## 2019-04-05 NOTE — H&P (Signed)
Paper H&P to be scanned into permanent record. H&P reviewed and patient re-examined. No changes. 

## 2019-04-05 NOTE — Transfer of Care (Signed)
Immediate Anesthesia Transfer of Care Note  Patient: Dawn Rivas  Procedure(s) Performed: KNEE ARTHROSCOPY WITH DEBRIDEMENT, DECOMPRESSION, REPAIR VERSUS PARTIAL MEDIAL MENISECTOMY, AND POSSIBLE LATERAL RELEASE (Left Knee)  Patient Location: PACU  Anesthesia Type: General ETT  Level of Consciousness: awake, alert  and patient cooperative  Airway and Oxygen Therapy: Patient Spontanous Breathing and Patient connected to supplemental oxygen  Post-op Assessment: Post-op Vital signs reviewed, Patient's Cardiovascular Status Stable, Respiratory Function Stable, Patent Airway and No signs of Nausea or vomiting  Post-op Vital Signs: Reviewed and stable  Complications: No apparent anesthesia complications

## 2019-04-10 ENCOUNTER — Encounter: Payer: Self-pay | Admitting: Surgery

## 2019-05-24 ENCOUNTER — Other Ambulatory Visit: Payer: Self-pay | Admitting: Physician Assistant

## 2019-05-24 DIAGNOSIS — Z1231 Encounter for screening mammogram for malignant neoplasm of breast: Secondary | ICD-10-CM

## 2019-06-10 ENCOUNTER — Other Ambulatory Visit: Payer: Self-pay | Admitting: Physician Assistant

## 2019-06-10 DIAGNOSIS — B001 Herpesviral vesicular dermatitis: Secondary | ICD-10-CM

## 2019-07-24 ENCOUNTER — Ambulatory Visit
Admission: RE | Admit: 2019-07-24 | Discharge: 2019-07-24 | Disposition: A | Discharge status: Home / Self Care | Payer: BC Managed Care – PPO | Source: Ambulatory Visit | Attending: Physician Assistant | Admitting: Physician Assistant

## 2019-07-24 ENCOUNTER — Telehealth: Payer: Self-pay

## 2019-07-24 DIAGNOSIS — Z1231 Encounter for screening mammogram for malignant neoplasm of breast: Secondary | ICD-10-CM | POA: Diagnosis present

## 2019-07-24 NOTE — Telephone Encounter (Signed)
-----   Message from Mar Daring, [place] sent at 07/24/2019  3:36 PM EDT ----- Normal mammogram. Repeat screening in one year.

## 2019-07-24 NOTE — Telephone Encounter (Signed)
Normal mammogram. Repeat screening in one year.  Written by Mar Daring, PA-C on 07/24/2019 3:36 PM EDT Seen by patient Encarnacion Slates on 07/24/2019 3:39 PM EDT

## 2019-07-27 NOTE — Progress Notes (Signed)
Patient: Dawn Rivas, Female    DOB: May 06, 1966, 54 y.o.   MRN: [id number] Visit Date: 07/28/2019  Today's Provider: Mar Daring, PA-C   Chief Complaint  Patient presents with  . Annual Exam   Subjective:     Annual physical exam Dawn Rivas is a 54 y.o. female who presents today for health maintenance and complete physical. She feels well. She reports exercising. She reports she is sleeping fairly well.  ----------------------------------------------------------------- 07/16/16-Pap is normal, HPV negative. Will repeat in 3-5 years 07/24/19-Normal mammogram. Repeat screening in one year. 08/01/14-Colonoscopy: Repeat in 10 years 07/12/2017: BMD shows osteopenia (T score -2.2); is still on Boniva (been on since 2018); Off letrozole x 1 year.   Review of Systems  Constitutional: Negative.   HENT: Negative.   Eyes: Negative.   Respiratory: Negative.   Cardiovascular: Negative.   Gastrointestinal: Negative.   Endocrine: Negative.   Genitourinary: Negative.   Musculoskeletal: Negative.   Skin: Negative.   Allergic/Immunologic: Negative.   Neurological: Negative.   Hematological: Negative.   Psychiatric/Behavioral: Negative.     Social History      She  reports that she has never smoked. She has never used smokeless tobacco. She reports current alcohol use. She reports that she does not use drugs.       Social History   Socioeconomic History  . Marital status: Married    Spouse name: Not on file  . Number of children: 3  . Years of education: Not on file  . Highest education level: Not on file  Occupational History    Employer: [employer]  Tobacco Use  . Smoking status: Never Smoker  . Smokeless tobacco: Never Used  Substance and Sexual Activity  . Alcohol use: Yes    Comment: drinks 1 x/month  . Drug use: No  . Sexual activity: Not on file  Other Topics Concern  . Not on file  Social History Narrative  . Not on file   Social  Determinants of Health   Financial Resource Strain:   . Difficulty of Paying Living Expenses:   Food Insecurity:   . Worried About Charity fundraiser in the Last Year:   . Arboriculturist in the Last Year:   Transportation Needs:   . Film/video editor (Medical):   Marland Kitchen Lack of Transportation (Non-Medical):   Physical Activity:   . Days of Exercise per Week:   . Minutes of Exercise per Session:   Stress:   . Feeling of Stress :   Social Connections:   . Frequency of Communication with Friends and Family:   . Frequency of Social Gatherings with Friends and Family:   . Attends Religious Services:   . Active Member of Clubs or Organizations:   . Attends Archivist Meetings:   Marland Kitchen Marital Status:     Past Medical History:  Diagnosis Date  . Breast cancer ([place]) 07/10/2013   T2, N1, ER/PR less than 5%, HER-2 negative.  Neoadjuvant chemotherapy. yPT1a,N0 post neoadjuvant treatment.  . Cancer ([place]) 2015   Invasive mammary carcinoma left breast, 2 microscopic foci residual tumor: 3.5 mm and one 0.0 mm respectively. Sentinel nodes negative x3. Closest margin 4 mm. Definitive affective neoadjuvant chemotherapy identified both of the breast sentinel lymph node. YyPT1a, N0.  ER 5%, PR 5%, HER-2/neu is not amplified.  . Personal history of chemotherapy   . Personal history of radiation therapy      Patient  Active Problem List   Diagnosis Date Noted  . Hematuria 07/16/2015  . Abnormal blood sugar 07/16/2015  . High risk medication use 07/16/2015  . Osteoporosis 07/16/2015  . GERD (gastroesophageal reflux disease) 07/16/2015  . Absence of menstruation 06/19/2015  . BMI 26.0-26.9,adult 06/19/2015  . Breast carcinoma ([place]) 06/19/2015  . Cold sore 06/19/2015  . Calcium blood increased 06/19/2015  . Cannot sleep 06/19/2015  . Irregular bleeding 06/19/2015  . Hypercholesterolemia without hypertriglyceridemia 06/19/2015  . Allergic rhinitis, seasonal 06/19/2015  . Anxiety  02/18/2015  . Encounter for screening colonoscopy 07/10/2014  . History of breast cancer 07/17/2013    Past Surgical History:  Procedure Laterality Date  . AUGMENTATION MAMMAPLASTY Bilateral    breast implants  . BREAST BIOPSY Left 2015   Left breast, 2 microscopic foci residual tumor: 3.5 mm and one 0.0 mm respectively. Sentinel nodes negative x3. Closest margin 4 mm. Definitive affective neoadjuvant chemotherapy identified both of the breast sentinel lymph node. YyPT1a, N0.  ER 5%, PR 5%, HER-2/neu is not amplified.  Marland Kitchen BREAST ENHANCEMENT SURGERY  7 years ago  . BREAST LUMPECTOMY Left 01/05/2014   Wide excision, mastoplasty, sentinel node biopsy. F/U Radiation and Chemo  . COLONOSCOPY  08/01/2014   Normal, repeat 2026.  Marland Kitchen KNEE ARTHROSCOPY WITH MEDIAL MENISECTOMY Left 04/05/2019   Procedure: KNEE ARTHROSCOPY WITH DEBRIDEMENT, DECOMPRESSION, REPAIR VERSUS PARTIAL MEDIAL MENISECTOMY, AND POSSIBLE LATERAL RELEASE;  Surgeon: Corky Mull, MD;  Location: [facility];  Service: Orthopedics;  Laterality: Left;  . PORTACATH PLACEMENT  07/24/13  . TUBAL LIGATION  1997  . UPPER GI ENDOSCOPY  09/28/13   hiatus hernia, la grade a reflux esophagitis, normal stomach, normal duodenum    Family History        Family Status  Relation Name Status  . Mother  Alive  . MGF  Deceased at age 65  . Father  Alive  . Sister  Alive  . Brother  Alive  . Son  Alive  . MGM  Alive  . PGM  Deceased at age 17  . PGF  Deceased at age 81  . Son  Alive  . Son  Alive  . Mat Aunt  (Not Specified)        Her family history includes Alzheimer's disease in her paternal grandmother; Breast cancer in her maternal aunt; Cervical cancer in her mother; Heart attack in her father and paternal grandfather; Heart disease in her father; Lung cancer in her maternal grandfather; Stroke in her paternal grandmother; Uterine cancer in her mother.      Allergies  Allergen Reactions  . Penicillins Hives  . Sulfur Hives      Current Outpatient Medications:  .  aspirin EC 81 MG tablet, Take by mouth., Disp: , Rfl:  .  BIOTIN PO, Take by mouth., Disp: , Rfl:  .  Calcium-Vitamin D-Vitamin K (VIACTIV PO), Take by mouth., Disp: , Rfl:  .  cholecalciferol (VITAMIN D3) 25 MCG (1000 UT) tablet, Take 1,000 Units by mouth daily., Disp: , Rfl:  .  HYDROcodone-acetaminophen (NORCO/VICODIN) 5-325 MG tablet, Take 1-2 tablets by mouth every 4 (four) hours as needed for moderate pain or severe pain., Disp: 40 tablet, Rfl: 0 .  ibandronate (BONIVA) 150 MG tablet, TAKE 1 TABLET BY MOUTH EVERY 30 DAYS, Disp: 1 tablet, Rfl: 11 .  LORazepam (ATIVAN) 1 MG tablet, TAKE 1/2 TO 1 TABLET BY MOUTH AT BEDTIME AS NEEDED, Disp: 30 tablet, Rfl: 5 .  Multiple Vitamins-Minerals (CENTRUM SILVER PO),  Take by mouth., Disp: , Rfl:  .  omeprazole (PRILOSEC) 20 MG capsule, Take 1 capsule (20 mg total) by mouth daily., Disp: 90 capsule, Rfl: 1 .  OVER THE COUNTER MEDICATION, Instaflex.  1 tab daily, Disp: , Rfl:  .  valACYclovir (VALTREX) 1000 MG tablet, TAKE 2 TABLETS (2,000 MG TOTAL) BY MOUTH 2 (TWO) TIMES DAILY. AS NEEDED, Disp: 16 tablet, Rfl: 5 .  nabumetone (RELAFEN) 500 MG tablet, Take 500 mg by mouth 2 (two) times daily as needed., Disp: , Rfl:    Patient Care Team: Mar Daring, PA-C as PCP - General (Family Medicine) Bary Castilla, Forest Gleason, MD (General Surgery) Lloyd Huger, MD as Consulting Physician (Oncology)    Objective:    Vitals: BP 116/89 (BP Location: Left Arm, Patient Position: Sitting, Cuff Size: Normal)   Pulse (!) 102   Temp (!) 96.9 F (36.1 C) (Temporal)   Resp 16   Ht '4\' 11"'  (1.499 m)   Wt 141 lb 6.4 oz (64.1 kg)   LMP 12/10/2011   BMI 28.56 kg/m    Vitals:   07/28/19 1553  BP: 116/89  Pulse: (!) 102  Resp: 16  Temp: (!) 96.9 F (36.1 C)  TempSrc: Temporal  Weight: 141 lb 6.4 oz (64.1 kg)  Height: '4\' 11"'  (1.499 m)     Physical Exam Vitals reviewed.  Constitutional:      General: She  is not in acute distress.    Appearance: Normal appearance. She is well-developed and overweight. She is not diaphoretic.  HENT:     Head: Normocephalic and atraumatic.     Right Ear: Tympanic membrane, ear canal and external ear normal.     Left Ear: Tympanic membrane, ear canal and external ear normal.  Eyes:     General: No scleral icterus.       Right eye: No discharge.        Left eye: No discharge.     Extraocular Movements: Extraocular movements intact.     Conjunctiva/sclera: Conjunctivae normal.     Pupils: Pupils are equal, round, and reactive to light.  Neck:     Thyroid: No thyromegaly.     Vascular: No carotid bruit or JVD.     Trachea: No tracheal deviation.  Cardiovascular:     Rate and Rhythm: Normal rate and regular rhythm.     Pulses: Normal pulses.     Heart sounds: Normal heart sounds. No murmur. No friction rub. No gallop.   Pulmonary:     Effort: Pulmonary effort is normal. No respiratory distress.     Breath sounds: Normal breath sounds. No wheezing or rales.  Chest:     Chest wall: No tenderness.  Abdominal:     General: Abdomen is flat. Bowel sounds are normal. There is no distension.     Palpations: Abdomen is soft. There is no mass.     Tenderness: There is no abdominal tenderness. There is no guarding or rebound.  Musculoskeletal:        General: No tenderness. Normal range of motion.     Cervical back: Normal range of motion and neck supple.     Right lower leg: No edema.     Left lower leg: No edema.  Lymphadenopathy:     Cervical: No cervical adenopathy.  Skin:    General: Skin is warm and dry.     Capillary Refill: Capillary refill takes less than 2 seconds.     Findings: No rash.  Neurological:  General: No focal deficit present.     Mental Status: She is alert and oriented to person, place, and time. Mental status is at baseline.  Psychiatric:        Mood and Affect: Mood normal.        Behavior: Behavior normal. Behavior is  cooperative.        Thought Content: Thought content normal.        Judgment: Judgment normal.      Depression Screen PHQ 2/9 Scores 07/28/2019 07/22/2018 07/19/2017 07/16/2016  PHQ - 2 Score 0 0 0 0  PHQ- 9 Score - - - 0       Assessment & Plan:     Routine Health Maintenance and Physical Exam  Exercise Activities and Dietary recommendations Goals   None     Immunization History  Administered Date(s) Administered  . Influenza Inj Mdck Quad With Preservative 02/14/2018  . Influenza,inj,Quad PF,6+ Mos 02/14/2017, 12/29/2018  . Tdap 01/07/2012  . Zoster Recombinat (Shingrix) 09/25/2017, 12/10/2017    Health Maintenance  Topic Date Due  . PAP SMEAR-Modifier  07/17/2019  . MAMMOGRAM  07/23/2021  . TETANUS/TDAP  01/06/2022  . COLONOSCOPY  07/31/2024  . INFLUENZA VACCINE  Completed  . Hepatitis C Screening  Completed  . HIV Screening  Completed     Discussed health benefits of physical activity, and encouraged her to engage in regular exercise appropriate for her age and condition.    1. Annual physical exam Normal physical exam today. Will check labs as below and f/u pending lab results. If labs are stable and WNL she will not need to have these rechecked for one year at her next annual physical exam. She is to call the office in the meantime if she has any acute issue, questions or concerns. - CBC with Differential/Platelet - Comprehensive metabolic panel - Hemoglobin A1c - Lipid panel - TSH  2. Malignant neoplasm of lower-outer quadrant of left breast of female, estrogen receptor positive ([place]) Stable. Followed by Dr. Bary Castilla and seeing him at the beginning of April.  - CBC with Differential/Platelet  3. Other osteoporosis without current pathological fracture Stable on Boniva. Started in 2018. May continue until 2023 if needed. Discussed Dr. Bary Castilla requesting to have her BMD ordered for repeat since she has been off letrozole x 1 year. She wanted to discuss with  him at her next visit in April.  - CBC with Differential/Platelet  4. Hypercholesterolemia without hypertriglyceridemia Diet controlled. Will check labs as below and f/u pending results. - CBC with Differential/Platelet - Comprehensive metabolic panel - Hemoglobin A1c - Lipid panel  5. Abnormal blood sugar Diet controlled. Will check labs as below and f/u pending results. - CBC with Differential/Platelet - Comprehensive metabolic panel - Hemoglobin A1c - Lipid panel  6. Anxiety Stable. Diagnosis pulled for medication refill. Continue current medical treatment plan. - LORazepam (ATIVAN) 1 MG tablet; Take 0.5-1 tablets (0.5-1 mg total) by mouth at bedtime as needed.  Dispense: 30 tablet; Refill: 5  --------------------------------------------------------------------    Mar Daring, PA-C  [facility]

## 2019-07-28 ENCOUNTER — Encounter: Payer: Self-pay | Admitting: Physician Assistant

## 2019-07-28 ENCOUNTER — Other Ambulatory Visit: Payer: Self-pay

## 2019-07-28 ENCOUNTER — Ambulatory Visit (INDEPENDENT_AMBULATORY_CARE_PROVIDER_SITE_OTHER): Payer: BC Managed Care – PPO | Admitting: Physician Assistant

## 2019-07-28 VITALS — BP 116/89 | HR 102 | Temp 96.9°F | Resp 16 | Ht 59.0 in | Wt 141.4 lb

## 2019-07-28 DIAGNOSIS — E7800 Pure hypercholesterolemia, unspecified: Secondary | ICD-10-CM | POA: Diagnosis not present

## 2019-07-28 DIAGNOSIS — Z0000 Encounter for general adult medical examination without abnormal findings: Secondary | ICD-10-CM | POA: Diagnosis not present

## 2019-07-28 DIAGNOSIS — Z170 Estrogen receptor positive status [ER+]: Secondary | ICD-10-CM

## 2019-07-28 DIAGNOSIS — C50512 Malignant neoplasm of lower-outer quadrant of left female breast: Secondary | ICD-10-CM

## 2019-07-28 DIAGNOSIS — F419 Anxiety disorder, unspecified: Secondary | ICD-10-CM

## 2019-07-28 DIAGNOSIS — M818 Other osteoporosis without current pathological fracture: Secondary | ICD-10-CM

## 2019-07-28 DIAGNOSIS — R7309 Other abnormal glucose: Secondary | ICD-10-CM

## 2019-07-28 MED ORDER — LORAZEPAM 1 MG PO TABS: 0.5000 mg | ORAL_TABLET | Freq: Every evening | ORAL | 5 refills | Status: DC | PRN

## 2019-07-28 NOTE — Patient Instructions (Signed)

## 2019-07-31 ENCOUNTER — Ambulatory Visit: Payer: BLUE CROSS/BLUE SHIELD | Admitting: Surgery

## 2019-08-15 ENCOUNTER — Encounter: Payer: Self-pay | Admitting: Physician Assistant

## 2019-08-15 DIAGNOSIS — M818 Other osteoporosis without current pathological fracture: Secondary | ICD-10-CM

## 2019-08-15 DIAGNOSIS — Z853 Personal history of malignant neoplasm of breast: Secondary | ICD-10-CM

## 2019-08-15 DIAGNOSIS — R7309 Other abnormal glucose: Secondary | ICD-10-CM

## 2019-08-16 LAB — CBC WITH DIFFERENTIAL/PLATELET
Basophils Absolute: 0 10*3/uL (ref 0.0–0.2)
Basos: 1 %
EOS (ABSOLUTE): 0.1 10*3/uL (ref 0.0–0.4)
Eos: 3 %
Hematocrit: 43.7 % (ref 34.0–46.6)
Hemoglobin: 14.6 g/dL (ref 11.1–15.9)
Immature Grans (Abs): 0 10*3/uL (ref 0.0–0.1)
Immature Granulocytes: 0 %
Lymphocytes Absolute: 1.8 10*3/uL (ref 0.7–3.1)
Lymphs: 43 %
MCH: 32 pg (ref 26.6–33.0)
MCHC: 33.4 g/dL (ref 31.5–35.7)
MCV: 96 fL (ref 79–97)
Monocytes Absolute: 0.4 10*3/uL (ref 0.1–0.9)
Monocytes: 9 %
Neutrophils Absolute: 1.8 10*3/uL (ref 1.4–7.0)
Neutrophils: 44 %
Platelets: 207 10*3/uL (ref 150–450)
RBC: 4.56 x10E6/uL (ref 3.77–5.28)
RDW: 12.5 % (ref 11.7–15.4)
WBC: 4.1 10*3/uL (ref 3.4–10.8)

## 2019-08-16 LAB — LIPID PANEL
Chol/HDL Ratio: 4.1 ratio (ref 0.0–4.4)
Cholesterol, Total: 214 mg/dL — ABNORMAL HIGH (ref 100–199)
HDL: 52 mg/dL (ref >39)
LDL Chol Calc (NIH): 130 mg/dL — ABNORMAL HIGH (ref 0–99)
Triglycerides: 183 mg/dL — ABNORMAL HIGH (ref 0–149)
VLDL Cholesterol Cal: 32 mg/dL (ref 5–40)

## 2019-08-16 LAB — COMPREHENSIVE METABOLIC PANEL
ALT: 20 IU/L (ref 0–32)
AST: 21 IU/L (ref 0–40)
Albumin/Globulin Ratio: 1.7 (ref 1.2–2.2)
Albumin: 4.5 g/dL (ref 3.8–4.9)
Alkaline Phosphatase: 46 IU/L (ref 39–117)
BUN/Creatinine Ratio: 16 (ref 9–23)
BUN: 13 mg/dL (ref 6–24)
Bilirubin Total: 0.3 mg/dL (ref 0.0–1.2)
CO2: 23 mmol/L (ref 20–29)
Calcium: 9.8 mg/dL (ref 8.7–10.2)
Chloride: 102 mmol/L (ref 96–106)
Creatinine, Ser: 0.81 mg/dL (ref 0.57–1.00)
GFR calc Af Amer: 95 mL/min/{1.73_m2} (ref >59)
GFR calc non Af Amer: 82 mL/min/{1.73_m2} (ref >59)
Globulin, Total: 2.6 g/dL (ref 1.5–4.5)
Glucose: 87 mg/dL (ref 65–99)
Potassium: 4.7 mmol/L (ref 3.5–5.2)
Sodium: 142 mmol/L (ref 134–144)
Total Protein: 7.1 g/dL (ref 6.0–8.5)

## 2019-08-16 LAB — HEMOGLOBIN A1C
Est. average glucose Bld gHb Est-mCnc: 100 mg/dL
Hgb A1c MFr Bld: 5.1 % (ref 4.8–5.6)

## 2019-08-16 LAB — TSH: TSH: 2.18 u[IU]/mL (ref 0.450–4.500)

## 2019-08-17 ENCOUNTER — Telehealth: Payer: Self-pay

## 2019-08-17 NOTE — Telephone Encounter (Signed)
Seen by patient Dawn Rivas on 08/17/2019 1:42 PM EDT

## 2019-08-17 NOTE — Telephone Encounter (Signed)
-----   Message from Mar Daring, [place] sent at 08/16/2019 11:56 AM EDT ----- Blood count is normal. Kidney and liver function are normal. Sodium, potassium and calcium is normal. Sugar/A1c is normal. Cholesterol is much improved , but still borderline elevated, compared to last year. Thyroid is normal.

## 2019-08-29 ENCOUNTER — Ambulatory Visit
Admission: RE | Admit: 2019-08-29 | Discharge: 2019-08-29 | Disposition: A | Discharge status: Home / Self Care | Payer: BC Managed Care – PPO | Source: Ambulatory Visit | Attending: Physician Assistant | Admitting: Physician Assistant

## 2019-08-29 DIAGNOSIS — R7309 Other abnormal glucose: Secondary | ICD-10-CM

## 2019-08-29 DIAGNOSIS — M818 Other osteoporosis without current pathological fracture: Secondary | ICD-10-CM | POA: Insufficient documentation

## 2019-08-29 DIAGNOSIS — Z853 Personal history of malignant neoplasm of breast: Secondary | ICD-10-CM

## 2019-08-30 ENCOUNTER — Telehealth: Payer: Self-pay

## 2019-08-30 NOTE — Telephone Encounter (Signed)
Result Communications   Result Notes and Comments to Patient Comment seen by patient ANYSSA SEDITA on 08/30/2019 11:51 AM EDT

## 2019-08-30 NOTE — Telephone Encounter (Signed)
-----   Message from Mar Daring, [place] sent at 08/30/2019 11:28 AM EDT ----- Bone density does show essential stability of osteopenia. There was a slight improvement from a T score of -2.2 to now -2.1 in the lumbar spine. However, there was a slight decrease from -1.8 to now -2.0 in the femoral neck. Continue Boniva and we can recheck bone density in 1-2 years.

## 2019-09-16 ENCOUNTER — Other Ambulatory Visit: Payer: Self-pay | Admitting: Physician Assistant

## 2019-09-16 DIAGNOSIS — K219 Gastro-esophageal reflux disease without esophagitis: Secondary | ICD-10-CM

## 2020-03-27 ENCOUNTER — Other Ambulatory Visit: Payer: Self-pay | Admitting: Physician Assistant

## 2020-03-27 DIAGNOSIS — F419 Anxiety disorder, unspecified: Secondary | ICD-10-CM

## 2020-03-27 NOTE — Telephone Encounter (Signed)
Requested medication (s) are due for refill today -yes  Requested medication (s) are on the active medication list -yes  Future visit scheduled -no  Last refill: 12/28/19  Notes to clinic: Request for non delegated Rx  Requested Prescriptions  Pending Prescriptions Disp Refills   LORazepam (ATIVAN) 1 MG tablet [Pharmacy Med Name: LORAZEPAM 1 MG TABLET] 30 tablet     Sig: Take 0.5-1 tablets (0.5-1 mg total) by mouth at bedtime as needed.      Not Delegated - Psychiatry:  Anxiolytics/Hypnotics Failed - 03/27/2020  4:27 PM      Failed - This refill cannot be delegated      Failed - Urine Drug Screen completed in last 360 days      Failed - Valid encounter within last 6 months    Recent Outpatient Visits           8 months ago Annual physical exam   [facility] [place], Clearnce Sorrel, [place]   1 year ago Annual physical exam   [facility] [place], Clearnce Sorrel, [place]   2 years ago [place] Trinna Post, [place]   2 years ago Annual physical exam   [facility] Mar Daring, [place]   3 years ago Annual physical exam   [facility] [place] M, [place]                  Requested Prescriptions  Pending Prescriptions Disp Refills   LORazepam (ATIVAN) 1 MG tablet [Pharmacy Med Name: LORAZEPAM 1 MG TABLET] 30 tablet     Sig: Take 0.5-1 tablets (0.5-1 mg total) by mouth at bedtime as needed.      Not Delegated - Psychiatry:  Anxiolytics/Hypnotics Failed - 03/27/2020  4:27 PM      Failed - This refill cannot be delegated      Failed - Urine Drug Screen completed in last 360 days      Failed - Valid encounter within last 6 months    Recent Outpatient Visits           8 months ago Annual physical exam   [facility] [place], Clearnce Sorrel, [place]   1 year ago Annual physical exam   [place], Clearnce Sorrel, [place]   2 years ago [place] Trinna Post, [place]   2 years ago Annual physical exam   [place], Clearnce Sorrel, [place]   3 years ago Annual physical exam   [facility] Mar Daring, [place]

## 2020-06-10 ENCOUNTER — Other Ambulatory Visit: Payer: Self-pay | Admitting: Physician Assistant

## 2020-06-10 DIAGNOSIS — Z1231 Encounter for screening mammogram for malignant neoplasm of breast: Secondary | ICD-10-CM

## 2020-07-09 HISTORY — PX: BREAST BIOPSY: SHX20

## 2020-07-18 ENCOUNTER — Other Ambulatory Visit: Payer: Self-pay | Admitting: Physician Assistant

## 2020-07-18 DIAGNOSIS — B001 Herpesviral vesicular dermatitis: Secondary | ICD-10-CM

## 2020-07-18 NOTE — Telephone Encounter (Signed)
Requested Prescriptions  Pending Prescriptions Disp Refills  . valACYclovir (VALTREX) 1000 MG tablet [Pharmacy Med Name: VALACYCLOVIR HCL 1 GRAM TABLET] 16 tablet 5    Sig: TAKE 2 TABLETS (2,000 MG TOTAL) BY MOUTH 2 (TWO) TIMES DAILY. AS NEEDED     Antimicrobials:  Antiviral Agents - Anti-Herpetic Passed - 07/18/2020  8:44 AM      Passed - Valid encounter within last 12 months    Recent Outpatient Visits          11 months ago Annual physical exam   [facility] [place], Clearnce Sorrel, [place]   1 year ago Annual physical exam   [place], Clearnce Sorrel, [place]   2 years ago [place] Trinna Post, [place]   3 years ago Annual physical exam   [facility] [place], Clearnce Sorrel, [place]   4 years ago Annual physical exam   [place], Clearnce Sorrel, [place]      Future Appointments            In 1 week Marlyn Corporal, Clearnce Sorrel, PA-C [employer], [place]

## 2020-07-24 ENCOUNTER — Other Ambulatory Visit: Payer: Self-pay

## 2020-07-24 ENCOUNTER — Ambulatory Visit
Admission: RE | Admit: 2020-07-24 | Discharge: 2020-07-24 | Disposition: A | Discharge status: Home / Self Care | Payer: BC Managed Care – PPO | Source: Ambulatory Visit | Attending: Physician Assistant | Admitting: Physician Assistant

## 2020-07-24 DIAGNOSIS — Z1231 Encounter for screening mammogram for malignant neoplasm of breast: Secondary | ICD-10-CM | POA: Diagnosis not present

## 2020-07-30 ENCOUNTER — Other Ambulatory Visit: Payer: Self-pay

## 2020-07-30 ENCOUNTER — Encounter: Payer: Self-pay | Admitting: Physician Assistant

## 2020-07-30 ENCOUNTER — Ambulatory Visit (INDEPENDENT_AMBULATORY_CARE_PROVIDER_SITE_OTHER): Payer: BC Managed Care – PPO | Admitting: Physician Assistant

## 2020-07-30 VITALS — BP 124/83 | HR 94 | Resp 16 | Ht 59.0 in | Wt 147.0 lb

## 2020-07-30 DIAGNOSIS — E7800 Pure hypercholesterolemia, unspecified: Secondary | ICD-10-CM

## 2020-07-30 DIAGNOSIS — Z0000 Encounter for general adult medical examination without abnormal findings: Secondary | ICD-10-CM | POA: Diagnosis not present

## 2020-07-30 DIAGNOSIS — N926 Irregular menstruation, unspecified: Secondary | ICD-10-CM

## 2020-07-30 DIAGNOSIS — R7309 Other abnormal glucose: Secondary | ICD-10-CM

## 2020-07-30 DIAGNOSIS — Z6829 Body mass index (BMI) 29.0-29.9, adult: Secondary | ICD-10-CM

## 2020-07-30 DIAGNOSIS — K219 Gastro-esophageal reflux disease without esophagitis: Secondary | ICD-10-CM

## 2020-07-30 DIAGNOSIS — F419 Anxiety disorder, unspecified: Secondary | ICD-10-CM

## 2020-07-30 DIAGNOSIS — Z79899 Other long term (current) drug therapy: Secondary | ICD-10-CM

## 2020-07-30 MED ORDER — OMEPRAZOLE 20 MG PO CPDR: DELAYED_RELEASE_CAPSULE | ORAL | 3 refills | Status: DC

## 2020-07-30 MED ORDER — LORAZEPAM 1 MG PO TABS: 0.5000 mg | ORAL_TABLET | Freq: Every evening | ORAL | 5 refills | Status: DC | PRN

## 2020-07-30 NOTE — Progress Notes (Signed)
Complete physical exam   Patient: Dawn Rivas   DOB: January 14, 1966   55 y.o. Female  MRN: [id number] Visit Date: 07/30/2020  Today's healthcare provider: Mar Daring, PA-C   Chief Complaint  Patient presents with  . Annual Exam   Subjective    Dawn Rivas is a 55 y.o. female who presents today for a complete physical exam.  She reports consuming a general diet. Gym/ health club routine includes treadmill and walking on track . She generally feels well. She reports sleeping fairly well. She does not have additional problems to discuss today.  HPI  07/16/16 Pap/HPV-negative 07/24/20 Mammogram-BI-RADS 1 08/29/19 BMD-Osteopenia 08/01/14 Colonoscopy-normal  Past Medical History:  Diagnosis Date  . Breast cancer ([place]) 07/10/2013   T2, N1, ER/PR less than 5%, HER-2 negative.  Neoadjuvant chemotherapy. yPT1a,N0 post neoadjuvant treatment.  . Cancer ([place]) 2015   Invasive mammary carcinoma left breast, 2 microscopic foci residual tumor: 3.5 mm and one 0.0 mm respectively. Sentinel nodes negative x3. Closest margin 4 mm. Definitive affective neoadjuvant chemotherapy identified both of the breast sentinel lymph node. YyPT1a, N0.  ER 5%, PR 5%, HER-2/neu is not amplified.  . Personal history of chemotherapy   . Personal history of radiation therapy    Past Surgical History:  Procedure Laterality Date  . AUGMENTATION MAMMAPLASTY Bilateral    breast implants  . BREAST BIOPSY Left 2015   Left breast, 2 microscopic foci residual tumor: 3.5 mm and one 0.0 mm respectively. Sentinel nodes negative x3. Closest margin 4 mm. Definitive affective neoadjuvant chemotherapy identified both of the breast sentinel lymph node. YyPT1a, N0.  ER 5%, PR 5%, HER-2/neu is not amplified.  Marland Kitchen BREAST ENHANCEMENT SURGERY  7 years ago  . BREAST LUMPECTOMY Left 01/05/2014   Wide excision, mastoplasty, sentinel node biopsy. F/U Radiation and Chemo  . COLONOSCOPY  08/01/2014   Normal, repeat 2026.  Marland Kitchen KNEE  ARTHROSCOPY WITH MEDIAL MENISECTOMY Left 04/05/2019   Procedure: KNEE ARTHROSCOPY WITH DEBRIDEMENT, DECOMPRESSION, REPAIR VERSUS PARTIAL MEDIAL MENISECTOMY, AND POSSIBLE LATERAL RELEASE;  Surgeon: Corky Mull, MD;  Location: [facility];  Service: Orthopedics;  Laterality: Left;  . PORTACATH PLACEMENT  07/24/13  . TUBAL LIGATION  1997  . UPPER GI ENDOSCOPY  09/28/13   hiatus hernia, la grade a reflux esophagitis, normal stomach, normal duodenum   Social History   Socioeconomic History  . Marital status: Married    Spouse name: Not on file  . Number of children: 3  . Years of education: Not on file  . Highest education level: Not on file  Occupational History    Employer: [employer]  Tobacco Use  . Smoking status: Never Smoker  . Smokeless tobacco: Never Used  Vaping Use  . Vaping Use: Never used  Substance and Sexual Activity  . Alcohol use: Yes    Comment: drinks 1 x/month  . Drug use: No  . Sexual activity: Not on file  Other Topics Concern  . Not on file  Social History Narrative  . Not on file   Social Determinants of Health   Financial Resource Strain: Not on file  Food Insecurity: Not on file  Transportation Needs: Not on file  Physical Activity: Not on file  Stress: Not on file  Social Connections: Not on file  Intimate Partner Violence: Not on file   Family Status  Relation Name Status  . Mother  Alive  . MGF  Deceased at age 8  . Father  Alive  . Sister  Alive  . Brother  Alive  . Son  Alive  . MGM  Alive  . PGM  Deceased at age 75  . PGF  Deceased at age 70  . Son  Alive  . Son  Alive  . Mat Aunt  (Not Specified)   Family History  Problem Relation Age of Onset  . Cervical cancer Mother   . Uterine cancer Mother   . Lung cancer Maternal Grandfather   . Heart disease Father   . Heart attack Father   . Stroke Paternal Grandmother   . Alzheimer's disease Paternal Grandmother   . Heart attack Paternal Grandfather   . Breast  cancer Maternal Aunt    Allergies  Allergen Reactions  . Elemental Sulfur Hives  . Penicillins Hives    Patient Care Team: Mar Daring, PA-C as PCP - General (Family Medicine) Bary Castilla, Forest Gleason, MD (General Surgery) Lloyd Huger, MD as Consulting Physician (Oncology)   Medications: Outpatient Medications Prior to Visit  Medication Sig  . aspirin EC 81 MG tablet Take by mouth.  Marland Kitchen BIOTIN PO Take by mouth.  . Calcium-Vitamin D-Vitamin K (VIACTIV PO) Take by mouth.  . cholecalciferol (VITAMIN D3) 25 MCG (1000 UT) tablet Take 1,000 Units by mouth daily.  Marland Kitchen ibandronate (BONIVA) 150 MG tablet TAKE 1 TABLET BY MOUTH EVERY 30 DAYS  . LORazepam (ATIVAN) 1 MG tablet TAKE 0.5-1 TABLETS (0.5-1 MG TOTAL) BY MOUTH AT BEDTIME AS NEEDED.  . Multiple Vitamins-Minerals (CENTRUM SILVER PO) Take by mouth.  Marland Kitchen omeprazole (PRILOSEC) 20 MG capsule TAKE 1 CAPSULE BY MOUTH EVERY DAY  . OVER THE COUNTER MEDICATION Instaflex.  1 tab daily  . valACYclovir (VALTREX) 1000 MG tablet TAKE 2 TABLETS (2,000 MG TOTAL) BY MOUTH 2 (TWO) TIMES DAILY. AS NEEDED   No facility-administered medications prior to visit.    Review of Systems  Constitutional: Negative.   HENT: Negative.   Eyes: Positive for itching.  Respiratory: Negative.   Cardiovascular: Negative.   Gastrointestinal: Negative.   Endocrine: Negative.   Genitourinary: Negative.   Musculoskeletal: Negative.   Skin: Negative.   Allergic/Immunologic: Positive for environmental allergies.  Neurological: Negative.   Hematological: Negative.   Psychiatric/Behavioral: Negative.     Last CBC Lab Results  Component Value Date   WBC 4.1 08/15/2019   HGB 14.6 08/15/2019   HCT 43.7 08/15/2019   MCV 96 08/15/2019   MCH 32.0 08/15/2019   RDW 12.5 08/15/2019   PLT 207 25/00/3704   Last metabolic panel Lab Results  Component Value Date   GLUCOSE 87 08/15/2019   NA 142 08/15/2019   K 4.7 08/15/2019   CL 102 08/15/2019   CO2 23  08/15/2019   BUN 13 08/15/2019   CREATININE 0.81 08/15/2019   GFRNONAA 82 08/15/2019   GFRAA 95 08/15/2019   CALCIUM 9.8 08/15/2019   PROT 7.1 08/15/2019   ALBUMIN 4.5 08/15/2019   LABGLOB 2.6 08/15/2019   AGRATIO 1.7 08/15/2019   BILITOT 0.3 08/15/2019   ALKPHOS 46 08/15/2019   AST 21 08/15/2019   ALT 20 08/15/2019   ANIONGAP 10 12/15/2013   Last lipids Lab Results  Component Value Date   CHOL 214 (H) 08/15/2019   HDL 52 08/15/2019   LDLCALC 130 (H) 08/15/2019   TRIG 183 (H) 08/15/2019   CHOLHDL 4.1 08/15/2019   Last hemoglobin A1c Lab Results  Component Value Date   HGBA1C 5.1 08/15/2019   Last thyroid functions Lab Results  Component Value Date   TSH 2.180 08/15/2019   Last vitamin D Lab Results  Component Value Date   VD25OH 36.2 07/16/2016     Objective    BP 124/83 (BP Location: Right Arm, Patient Position: Sitting, Cuff Size: Large)   Pulse 94   Resp 16   Ht '4\' 11"'  (1.499 m)   Wt 147 lb (66.7 kg)   LMP 12/10/2011   SpO2 99%   BMI 29.69 kg/m  BP Readings from Last 3 Encounters:  07/30/20 124/83  07/28/19 116/89  04/05/19 116/75   Wt Readings from Last 3 Encounters:  07/30/20 147 lb (66.7 kg)  07/28/19 141 lb 6.4 oz (64.1 kg)  04/05/19 137 lb (62.1 kg)      Physical Exam Vitals reviewed.  Constitutional:      General: She is not in acute distress.    Appearance: Normal appearance. She is well-developed, well-groomed and overweight. She is not diaphoretic.  HENT:     Head: Normocephalic and atraumatic.     Right Ear: Tympanic membrane, ear canal and external ear normal.     Left Ear: Tympanic membrane, ear canal and external ear normal.  Eyes:     General: No scleral icterus.       Right eye: No discharge.        Left eye: No discharge.     Extraocular Movements: Extraocular movements intact.     Conjunctiva/sclera: Conjunctivae normal.     Pupils: Pupils are equal, round, and reactive to light.  Neck:     Thyroid: No thyromegaly.      Vascular: No carotid bruit or JVD.     Trachea: No tracheal deviation.  Cardiovascular:     Rate and Rhythm: Normal rate and regular rhythm.     Pulses: Normal pulses.     Heart sounds: Normal heart sounds. No murmur heard. No friction rub. No gallop.   Pulmonary:     Effort: Pulmonary effort is normal. No respiratory distress.     Breath sounds: Normal breath sounds. No wheezing or rales.  Chest:     Chest wall: No tenderness.  Abdominal:     General: Abdomen is flat. Bowel sounds are normal. There is no distension.     Palpations: Abdomen is soft. There is no mass.     Tenderness: There is no abdominal tenderness. There is no guarding or rebound.  Musculoskeletal:        General: No tenderness. Normal range of motion.     Cervical back: Normal range of motion and neck supple. No tenderness.     Right lower leg: No edema.     Left lower leg: No edema.  Lymphadenopathy:     Cervical: No cervical adenopathy.  Skin:    General: Skin is warm and dry.     Capillary Refill: Capillary refill takes less than 2 seconds.     Findings: No rash.  Neurological:     General: No focal deficit present.     Mental Status: She is alert and oriented to person, place, and time. Mental status is at baseline.     Motor: No weakness.     Gait: Gait normal.  Psychiatric:        Mood and Affect: Mood normal.        Behavior: Behavior normal. Behavior is cooperative.        Thought Content: Thought content normal.        Judgment: Judgment normal.  Last depression screening scores PHQ 2/9 Scores 07/30/2020 07/28/2019 07/22/2018  PHQ - 2 Score 0 0 0  PHQ- 9 Score 1 - -   Last fall risk screening Fall Risk  07/30/2020  Falls in the past year? 0  Number falls in past yr: 0  Injury with Fall? 0  Risk for fall due to : No Fall Risks  Follow up Falls evaluation completed   Last Audit-C alcohol use screening Alcohol Use Disorder Test (AUDIT) 07/30/2020  1. How often do you have a drink  containing alcohol? 1  2. How many drinks containing alcohol do you have on a typical day when you are drinking? 1  3. How often do you have six or more drinks on one occasion? 0  AUDIT-C Score 2  Alcohol Brief Interventions/Follow-up AUDIT Score <7 follow-up not indicated   A score of 3 or more in women, and 4 or more in men indicates increased risk for alcohol abuse, EXCEPT if all of the points are from question 1   No results found for any visits on 07/30/20.  Assessment & Plan    Routine Health Maintenance and Physical Exam  Exercise Activities and Dietary recommendations Goals   None     Immunization History  Administered Date(s) Administered  . Influenza Inj Mdck Quad With Preservative 02/14/2018  . Influenza,inj,Quad PF,6+ Mos 02/14/2017, 12/29/2018  . PFIZER(Purple Top)SARS-COV-2 Vaccination 08/02/2019, 08/30/2019, 03/04/2020  . Tdap 01/07/2012  . Zoster Recombinat (Shingrix) 09/25/2017, 12/10/2017    Health Maintenance  Topic Date Due  . INFLUENZA VACCINE  12/10/2019  . COVID-19 Vaccine (4 - Booster for Pfizer series) 09/02/2020  . PAP SMEAR-Modifier  07/16/2021  . TETANUS/TDAP  01/06/2022  . MAMMOGRAM  07/25/2022  . COLONOSCOPY (Pts 45-11yr Insurance coverage will need to be confirmed)  07/31/2024  . Hepatitis C Screening  Completed  . HIV Screening  Completed  . HPV VACCINES  Aged Out    Discussed health benefits of physical activity, and encouraged her to engage in regular exercise appropriate for her age and condition.  1. Annual physical exam Normal physical exam today. Will check labs as below and f/u pending lab results. If labs are stable and WNL she will not need to have these rechecked for one year at her next annual physical exam. She is to call the office in the meantime if she has any acute issue, questions or concerns. - CBC with Differential/Platelet - Comprehensive metabolic panel - Lipid Panel With LDL/HDL Ratio - TSH - Hemoglobin A1c  2.  Hypercholesterolemia without hypertriglyceridemia Diet controlled. Will check labs as below and f/u pending results. - Comprehensive metabolic panel - Lipid Panel With LDL/HDL Ratio  3. Calcium blood increased H/O this. Off letrozole now. Will check labs as below and f/u pending results. - Comprehensive metabolic panel  4. Irregular bleeding Resolved, postmenopausal. Will check labs as below and f/u pending results. - CBC with Differential/Platelet  5. Abnormal blood sugar Diet controlled. Will check labs as below and f/u pending results. - Hemoglobin A1c  6. High risk medication use Will check labs as below and f/u pending results. - TSH  7. Anxiety Stable. Diagnosis pulled for medication refill. Continue current medical treatment plan. Will check labs as below and f/u pending results. - TSH - LORazepam (ATIVAN) 1 MG tablet; Take 0.5-1 tablets (0.5-1 mg total) by mouth at bedtime as needed.  Dispense: 30 tablet; Refill: 5  8. BMI 29.0-29.9,adult Counseled patient on healthy lifestyle modifications including dieting and exercise.  9. Gastroesophageal reflux disease without esophagitis Stable. Diagnosis pulled for medication refill. Continue current medical treatment plan. - omeprazole (PRILOSEC) 20 MG capsule; TAKE 1 CAPSULE BY MOUTH EVERY DAY  Dispense: 90 capsule; Refill: 3   No follow-ups on file.     Reynolds Bowl, PA-C, have reviewed all documentation for this visit. The documentation on 07/30/20 for the exam, diagnosis, procedures, and orders are all accurate and complete.   Rubye Beach  [facility] [phone] (phone) [phone] (fax)  [place]

## 2020-07-30 NOTE — Patient Instructions (Incomplete)
Preventive Care 57-57 Years Old, Female Preventive care refers to lifestyle choices and visits with your health care provider that can promote health and wellness. This includes:  A yearly physical exam. This is also called an annual wellness visit.  Regular dental and eye exams.  Immunizations.  Screening for certain conditions.  Healthy lifestyle choices, such as: ? Eating a healthy diet. ? Getting regular exercise. ? Not using drugs or products that contain nicotine and tobacco. ? Limiting alcohol use. What can I expect for my preventive care visit? Physical exam Your health care provider will check your:  Height and weight. These may be used to calculate your BMI (body mass index). BMI is a measurement that tells if you are at a healthy weight.  Heart rate and blood pressure.  Body temperature.  Skin for abnormal spots. Counseling Your health care provider may ask you questions about your:  Past medical problems.  Family's medical history.  Alcohol, tobacco, and drug use.  Emotional well-being.  Home life and relationship well-being.  Sexual activity.  Diet, exercise, and sleep habits.  Work and work Statistician.  Access to firearms.  Method of birth control.  Menstrual cycle.  Pregnancy history. What immunizations do I need? Vaccines are usually given at various ages, according to a schedule. Your health care provider will recommend vaccines for you based on your age, medical history, and lifestyle or other factors, such as travel or where you work.   What tests do I need? Blood tests  Lipid and cholesterol levels. These may be checked every 5 years, or more often if you are over 3 years old.  Hepatitis C test.  Hepatitis B test. Screening  Lung cancer screening. You may have this screening every year starting at age 57 if you have a 30-pack-year history of smoking and currently smoke or have quit within the past 15 years.  Colorectal cancer  screening. ? All adults should have this screening starting at age 57 and continuing until age 17. ? Your health care provider may recommend screening at age 57 if you are at increased risk. ? You will have tests every 1-10 years, depending on your results and the type of screening test.  Diabetes screening. ? This is done by checking your blood sugar (glucose) after you have not eaten for a while (fasting). ? You may have this done every 1-3 years.  Mammogram. ? This may be done every 1-2 years. ? Talk with your health care provider about when you should start having regular mammograms. This may depend on whether you have a family history of breast cancer.  BRCA-related cancer screening. This may be done if you have a family history of breast, ovarian, tubal, or peritoneal cancers.  Pelvic exam and Pap test. ? This may be done every 3 years starting at age 57. ? Starting at age 57, this may be done every 5 years if you have a Pap test in combination with an HPV test. Other tests  STD (sexually transmitted disease) testing, if you are at risk.  Bone density scan. This is done to screen for osteoporosis. You may have this scan if you are at high risk for osteoporosis. Talk with your health care provider about your test results, treatment options, and if necessary, the need for more tests. Follow these instructions at home: Eating and drinking  Eat a diet that includes fresh fruits and vegetables, whole grains, lean protein, and low-fat dairy products.  Take vitamin and mineral supplements  as recommended by your health care provider.  Do not drink alcohol if: ? Your health care provider tells you not to drink. ? You are pregnant, may be pregnant, or are planning to become pregnant.  If you drink alcohol: ? Limit how much you have to 0-1 drink a day. ? Be aware of how much alcohol is in your drink. In the [place] one drink equals one 12 oz bottle of beer (355 mL), one 5 oz glass of  wine (148 mL), or one 1 oz glass of hard liquor (44 mL).   Lifestyle  Take daily care of your teeth and gums. Brush your teeth every morning and night with fluoride toothpaste. Floss one time each day.  Stay active. Exercise for at least 30 minutes 5 or more days each week.  Do not use any products that contain nicotine or tobacco, such as cigarettes, e-cigarettes, and chewing tobacco. If you need help quitting, ask your health care provider.  Do not use drugs.  If you are sexually active, practice safe sex. Use a condom or other form of protection to prevent STIs (sexually transmitted infections).  If you do not wish to become pregnant, use a form of birth control. If you plan to become pregnant, see your health care provider for a prepregnancy visit.  If told by your health care provider, take low-dose aspirin daily starting at age 57.  Find healthy ways to cope with stress, such as: ? Meditation, yoga, or listening to music. ? Journaling. ? Talking to a trusted person. ? Spending time with friends and family. Safety  Always wear your seat belt while driving or riding in a vehicle.  Do not drive: ? If you have been drinking alcohol. Do not ride with someone who has been drinking. ? When you are tired or distracted. ? While texting.  Wear a helmet and other protective equipment during sports activities.  If you have firearms in your house, make sure you follow all gun safety procedures. What's next?  Visit your health care provider once a year for an annual wellness visit.  Ask your health care provider how often you should have your eyes and teeth checked.  Stay up to date on all vaccines. This information is not intended to replace advice given to you by your health care provider. Make sure you discuss any questions you have with your health care provider. Document Revised: 01/30/2020 Document Reviewed: 01/06/2018 Elsevier Patient Education  2021 Elsevier Inc.  

## 2020-08-05 ENCOUNTER — Other Ambulatory Visit: Payer: Self-pay | Admitting: General Surgery

## 2020-08-07 LAB — SURGICAL PATHOLOGY

## 2020-09-25 ENCOUNTER — Other Ambulatory Visit: Payer: Self-pay | Admitting: Physician Assistant

## 2020-09-25 DIAGNOSIS — F419 Anxiety disorder, unspecified: Secondary | ICD-10-CM

## 2020-09-25 NOTE — Telephone Encounter (Signed)
Requested medication (s) are due for refill today: no  Requested medication (s) are on the active medication list: yes   Last refill: 07/18/2020  Future visit scheduled: no  Notes to clinic:  this refill cannot be delegated    Requested Prescriptions  Pending Prescriptions Disp Refills   LORazepam (ATIVAN) 1 MG tablet [Pharmacy Med Name: LORAZEPAM 1 MG TABLET] 30 tablet     Sig: Take 0.5-1 tablets (0.5-1 mg total) by mouth at bedtime as needed.      Not Delegated - Psychiatry:  Anxiolytics/Hypnotics Failed - 09/25/2020  7:45 AM      Failed - This refill cannot be delegated      Failed - Urine Drug Screen completed in last 360 days      Passed - Valid encounter within last 6 months    Recent Outpatient Visits           1 month ago Annual physical exam   [facility] [place], Clearnce Sorrel, [place]   1 year ago Annual physical exam   [facility] [place], Clearnce Sorrel, [place]   2 years ago Annual physical exam   [facility] [place], Clearnce Sorrel, [place]   3 years ago [place] Trinna Post, [place]   3 years ago Annual physical exam   [facility] Mar Daring, [place]

## 2020-11-07 ENCOUNTER — Ambulatory Visit: Payer: Self-pay | Admitting: *Deleted

## 2020-11-07 NOTE — Telephone Encounter (Signed)
Patient can be reached by cell phone [phone] after 5   ----- Message from [place] sent at 11/07/2020  3:12 PM EDT -----  Patient shares that they have been experiencing a sore throat since 11/02/20   Patient is not experiencing any other symptoms presently   Please contact to further advise when possible   Called patient to review symptoms. C/o sore throat since 11/02/20. Reports no other symptoms but when questioned reports soreness with swallowing. Has had ear pain when swallowing and hoarseness noted. Denies fever, difficulty breathing . Has not been tested for covid. No available appt noted this week with any provider. Patient reports she will  go to [facility] . Care advise given. Patient verbalized understanding of care advise and to call back or go to [facility] or ED if symptoms worsen.

## 2020-11-07 NOTE — Telephone Encounter (Signed)
Reason for Disposition  [1] Sore throat is the only symptom AND [2] present > 48 hours  Answer Assessment - Initial Assessment Questions 1. ONSET: "When did the throat start hurting?" (Hours or days ago)      11/02/20 2. SEVERITY: "How bad is the sore throat?" (Scale 1-10; mild, moderate or severe)   - MILD (1-3):  doesn't interfere with eating or normal activities   - MODERATE (4-7): interferes with eating some solids and normal activities   - SEVERE (8-10):  excruciating pain, interferes with most normal activities   - SEVERE DYSPHAGIA: can't swallow liquids, drooling     Moderate- soreness with swallowing  3. STREP EXPOSURE: "Has there been any exposure to strep within the past week?" If Yes, ask: "What type of contact occurred?"      na 4.  VIRAL SYMPTOMS: "Are there any symptoms of a cold, such as a runny nose, cough, hoarse voice or red eyes?"      Cough, hoarse, ear pain  5. FEVER: "Do you have a fever?" If Yes, ask: "What is your temperature, how was it measured, and when did it start?"     na 6. PUS ON THE TONSILS: "Is there pus on the tonsils in the back of your throat?"     na 7. OTHER SYMPTOMS: "Do you have any other symptoms?" (e.g., difficulty breathing, headache, rash)     na 8. PREGNANCY: "Is there any chance you are pregnant?" "When was your last menstrual period?"     na  Protocols used: Sore Throat-A-AH

## 2020-11-28 ENCOUNTER — Other Ambulatory Visit: Payer: Self-pay | Admitting: Family Medicine

## 2020-11-28 DIAGNOSIS — F419 Anxiety disorder, unspecified: Secondary | ICD-10-CM

## 2020-11-28 NOTE — Telephone Encounter (Signed)
Pt called to follow up on this Rx.  Please call pt if she needs to make appt.  Pt declined to make appt at time.

## 2020-11-29 NOTE — Telephone Encounter (Signed)
Needs f/u appt before further refills will be given

## 2021-01-02 ENCOUNTER — Encounter: Payer: Self-pay | Admitting: Family Medicine

## 2021-01-02 ENCOUNTER — Ambulatory Visit: Payer: BC Managed Care – PPO | Admitting: Family Medicine

## 2021-01-02 ENCOUNTER — Other Ambulatory Visit: Payer: Self-pay

## 2021-01-02 VITALS — BP 115/76 | HR 98 | Temp 97.6°F | Ht 59.0 in | Wt 134.4 lb

## 2021-01-02 DIAGNOSIS — G4700 Insomnia, unspecified: Secondary | ICD-10-CM | POA: Diagnosis not present

## 2021-01-02 MED ORDER — TRAZODONE HCL 50 MG PO TABS: 25.0000 mg | ORAL_TABLET | Freq: Every evening | ORAL | 3 refills | Status: DC | PRN

## 2021-01-02 NOTE — Progress Notes (Signed)
Established patient visit   Patient: Dawn Rivas   DOB: 1966-04-28   55 y.o. Female  MRN: [id number] Visit Date: 01/02/2021  Today's healthcare provider: Lavon Paganini, MD   Chief Complaint  Patient presents with   Medication Refill   Follow-up   Subjective    Insomnia - Pt. states that she takes 1/2 pill of Lorazepam 5-6 nights/week - She feels that Lorazepam is mildly helpful for sleep; continues to have sleep difficulty ~2x/week - Pt. states that she has tried melatonin, Z-quil, & Tylenol PM without relief     Medications: Outpatient Medications Prior to Visit  Medication Sig   aspirin EC 81 MG tablet Take by mouth.   BIOTIN PO Take by mouth.   Calcium-Vitamin D-Vitamin K (VIACTIV PO) Take by mouth.   cholecalciferol (VITAMIN D3) 25 MCG (1000 UT) tablet Take 1,000 Units by mouth daily.   Multiple Vitamins-Minerals (CENTRUM SILVER PO) Take by mouth.   omeprazole (PRILOSEC) 20 MG capsule TAKE 1 CAPSULE BY MOUTH EVERY DAY   OVER THE COUNTER MEDICATION Instaflex.  1 tab daily   valACYclovir (VALTREX) 1000 MG tablet TAKE 2 TABLETS (2,000 MG TOTAL) BY MOUTH 2 (TWO) TIMES DAILY. AS NEEDED   [DISCONTINUED] LORazepam (ATIVAN) 1 MG tablet TAKE 0.5-1 TABLETS (0.5-1 MG TOTAL) BY MOUTH AT BEDTIME AS NEEDED.   [DISCONTINUED] ibandronate (BONIVA) 150 MG tablet TAKE 1 TABLET BY MOUTH EVERY 30 DAYS   No facility-administered medications prior to visit.    Review of Systems  Constitutional:  Negative for activity change, chills and fever.  HENT: Negative.    Eyes: Negative.   Respiratory: Negative.  Negative for chest tightness and shortness of breath.   Gastrointestinal: Negative.   Endocrine: Negative.   Genitourinary: Negative.   Musculoskeletal: Negative.   Skin: Negative.   Neurological: Negative.  Negative for dizziness and light-headedness.  Psychiatric/Behavioral:  Positive for sleep disturbance.       Objective    BP 115/76 (BP Location: Right Arm, Patient  Position: Sitting, Cuff Size: Normal)   Pulse 98   Temp 97.6 F (36.4 C) (Oral)   Ht '4\' 11"'$  (1.499 m)   Wt 134 lb 6.4 oz (61 kg)   LMP 12/10/2011   SpO2 98%   BMI 27.15 kg/m     Physical Exam Constitutional:      Appearance: Normal appearance. She is normal weight.  HENT:     Head: Normocephalic and atraumatic.     Right Ear: External ear normal.     Left Ear: External ear normal.  Eyes:     Conjunctiva/sclera: Conjunctivae normal.  Cardiovascular:     Rate and Rhythm: Normal rate and regular rhythm.     Pulses: Normal pulses.     Heart sounds: Normal heart sounds.  Pulmonary:     Effort: Pulmonary effort is normal.     Breath sounds: Normal breath sounds.  Abdominal:     General: Abdomen is flat. Bowel sounds are normal.     Palpations: Abdomen is soft.  Skin:    General: Skin is warm and dry.  Neurological:     Mental Status: She is alert.  Psychiatric:        Behavior: Behavior normal.        Thought Content: Thought content normal.     No results found for any visits on 01/02/21.  Assessment & Plan     Problem List Items Addressed This Visit       Other  Cannot sleep - Primary    - Discontinue Lorazepam - Start Trazodone 50 mg - Will continue to monitor - F/u in 1-2 months        Return in about 2 months (around 03/04/2021) for chronic disease f/u, With new PCP.        Percell Locus, MS3    Patient seen along with MS3 student Percell Locus. I personally evaluated this patient along with the student, and verified all aspects of the history, physical exam, and medical decision making as documented by the student. I agree with the student's documentation and have made all necessary edits.  Ladaja Yusupov, Dionne Bucy, MD, MPH [facility]

## 2021-01-02 NOTE — Assessment & Plan Note (Addendum)
-   Discontinue Lorazepam - Start Trazodone 50 mg - Will continue to monitor - F/u in 1-2 months

## 2021-03-05 ENCOUNTER — Ambulatory Visit: Payer: BC Managed Care – PPO | Admitting: Family Medicine

## 2021-03-05 ENCOUNTER — Encounter: Payer: Self-pay | Admitting: Family Medicine

## 2021-03-05 ENCOUNTER — Other Ambulatory Visit: Payer: Self-pay

## 2021-03-05 VITALS — BP 124/85 | HR 91 | Temp 97.0°F | Resp 15 | Wt 131.8 lb

## 2021-03-05 DIAGNOSIS — E663 Overweight: Secondary | ICD-10-CM

## 2021-03-05 DIAGNOSIS — F419 Anxiety disorder, unspecified: Secondary | ICD-10-CM | POA: Diagnosis not present

## 2021-03-05 DIAGNOSIS — Z23 Encounter for immunization: Secondary | ICD-10-CM | POA: Diagnosis not present

## 2021-03-05 DIAGNOSIS — G4700 Insomnia, unspecified: Secondary | ICD-10-CM | POA: Diagnosis not present

## 2021-03-05 DIAGNOSIS — B001 Herpesviral vesicular dermatitis: Secondary | ICD-10-CM

## 2021-03-05 NOTE — Assessment & Plan Note (Signed)
Doing well on every other day medication dosing, denies complications of nausea/headaches/etc Weight loss noted since last office visit BMI 26.62

## 2021-03-05 NOTE — Assessment & Plan Note (Signed)
Provided today; consent reviewed

## 2021-03-05 NOTE — Assessment & Plan Note (Signed)
Self reported hx; reports sticks to a schedule where she has to follow same routine daily in order to prevent excess anxiety; not on additional medications nor wishing to start at this time

## 2021-03-05 NOTE — Assessment & Plan Note (Addendum)
Chronic, infrequent Stable at this time, has medication for PRN basis  recent outbreaks well controlled with medication; no refills needed

## 2021-03-05 NOTE — Assessment & Plan Note (Addendum)
Discussed transition off benzo for sleep- reported doing well on benzo/informed in a change in practice overall based on reasearch trends/medication addiction risk Current stable on 1/2 tablet;  25 mg dose of trazodone; denies complications Is not in need of refill at this time

## 2021-03-05 NOTE — Progress Notes (Signed)
Established patient visit   Patient: Dawn Rivas   DOB: 01-18-1966   55 y.o. Female  MRN: [id number] Visit Date: 03/05/2021  Today's healthcare provider: Gwyneth Sprout, FNP   Chief Complaint  Patient presents with   Anxiety   Gastroesophageal Reflux   Insomnia   Subjective    HPI   Follow up for Insomnia  The patient was last seen for this 2 months ago. Changes made at last visit include start Trazodone 50mg .  She reports excellent compliance with treatment. She feels that condition is Improved. She is having side effects. Patient states shortly after taking medication at night she experiences symptom of congestion and states this last a few hours every night.  -----------------------------------------------------------------------------------------    Medications: Outpatient Medications Prior to Visit  Medication Sig   aspirin EC 81 MG tablet Take by mouth.   BIOTIN PO Take by mouth.   Calcium-Vitamin D-Vitamin K (VIACTIV PO) Take by mouth.   cholecalciferol (VITAMIN D3) 25 MCG (1000 UT) tablet Take 1,000 Units by mouth daily.   Multiple Vitamins-Minerals (CENTRUM SILVER PO) Take by mouth.   OVER THE COUNTER MEDICATION Instaflex.  1 tab daily   phentermine (ADIPEX-P) 37.5 MG tablet Take 37.5 mg by mouth daily.   traZODone (DESYREL) 50 MG tablet Take 0.5-1 tablets (25-50 mg total) by mouth at bedtime as needed for sleep.   valACYclovir (VALTREX) 1000 MG tablet TAKE 2 TABLETS (2,000 MG TOTAL) BY MOUTH 2 (TWO) TIMES DAILY. AS NEEDED   [DISCONTINUED] omeprazole (PRILOSEC) 20 MG capsule TAKE 1 CAPSULE BY MOUTH EVERY DAY   No facility-administered medications prior to visit.    Review of Systems     Objective    BP 124/85   Pulse 91   Temp (!) 97 F (36.1 C) (Oral)   Resp 15   Wt 131 lb 12.8 oz (59.8 kg)   LMP 12/10/2011   BMI 26.62 kg/m    Physical Exam Vitals and nursing note reviewed.  Constitutional:      General: She is not in acute  distress.    Appearance: Normal appearance. She is overweight. She is not ill-appearing, toxic-appearing or diaphoretic.  HENT:     Head: Normocephalic and atraumatic.  Cardiovascular:     Rate and Rhythm: Normal rate and regular rhythm.     Pulses: Normal pulses.     Heart sounds: Normal heart sounds. No murmur heard.   No friction rub. No gallop.  Pulmonary:     Effort: Pulmonary effort is normal. No respiratory distress.     Breath sounds: Normal breath sounds. No stridor. No wheezing, rhonchi or rales.  Chest:     Chest wall: No tenderness.  Abdominal:     General: Bowel sounds are normal.     Palpations: Abdomen is soft.  Musculoskeletal:        General: No swelling, tenderness, deformity or signs of injury. Normal range of motion.     Right lower leg: No edema.     Left lower leg: No edema.  Skin:    General: Skin is warm and dry.     Capillary Refill: Capillary refill takes less than 2 seconds.     Coloration: Skin is not jaundiced or pale.     Findings: No bruising, erythema, lesion or rash.  Neurological:     General: No focal deficit present.     Mental Status: She is alert and oriented to person, place, and time. Mental status is  at baseline.     Cranial Nerves: No cranial nerve deficit.     Sensory: No sensory deficit.     Motor: No weakness.     Coordination: Coordination normal.  Psychiatric:        Mood and Affect: Mood normal.        Behavior: Behavior normal.        Thought Content: Thought content normal.        Judgment: Judgment normal.    No results found for any visits on 03/05/21.  Assessment & Plan     Problem List Items Addressed This Visit       Digestive   Cold sore    Chronic, infrequent Stable at this time, has medication for PRN basis  recent outbreaks well controlled with medication; no refills needed        Other   Anxiety    Self reported hx; reports sticks to a schedule where she has to follow same routine daily in order to  prevent excess anxiety; not on additional medications nor wishing to start at this time      Flu vaccine need    Provided today; consent reviewed      Relevant Orders   Flu Vaccine QUAD 6+ mos PF IM (Fluarix Quad PF)   Insomnia - Primary    Discussed transition off benzo for sleep- reported doing well on benzo/informed in a change in practice overall based on reasearch trends/medication addiction risk Current stable on 1/2 tablet;  25 mg dose of trazodone; denies complications Is not in need of refill at this time      Overweight (BMI 25.0-29.9)    Doing well on every other day medication dosing, denies complications of nausea/headaches/etc Weight loss noted since last office visit BMI 26.62        Return in about 6 months (around 09/03/2021) for annual examination, chonic disease management.      Vonna Kotyk, FNP, have reviewed all documentation for this visit. The documentation on 03/05/21 for the exam, diagnosis, procedures, and orders are all accurate and complete.    Gwyneth Sprout, [place] [phone] (phone) [phone] (fax)  [place]

## 2021-04-15 ENCOUNTER — Other Ambulatory Visit: Payer: Self-pay

## 2021-04-15 ENCOUNTER — Ambulatory Visit: Payer: BC Managed Care – PPO | Admitting: Family Medicine

## 2021-04-15 ENCOUNTER — Encounter: Payer: Self-pay | Admitting: Family Medicine

## 2021-04-15 VITALS — BP 133/87 | HR 116 | Temp 98.0°F | Wt 131.0 lb

## 2021-04-15 DIAGNOSIS — R300 Dysuria: Secondary | ICD-10-CM

## 2021-04-15 DIAGNOSIS — N3090 Cystitis, unspecified without hematuria: Secondary | ICD-10-CM | POA: Diagnosis not present

## 2021-04-15 LAB — POCT URINALYSIS DIPSTICK
Bilirubin, UA: NEGATIVE
Glucose, UA: NEGATIVE
Ketones, UA: NEGATIVE
Nitrite, UA: POSITIVE
Protein, UA: POSITIVE — AB
Spec Grav, UA: >=1.03 — AB (ref 1.010–1.025)
Urobilinogen, UA: 0.2 E.U./dL
pH, UA: 5 (ref 5.0–8.0)

## 2021-04-15 MED ORDER — CIPROFLOXACIN HCL 500 MG PO TABS: 500.0000 mg | ORAL_TABLET | Freq: Two times a day (BID) | ORAL | 0 refills | Status: AC

## 2021-04-15 NOTE — Progress Notes (Signed)
Established patient visit   Patient: Dawn Rivas   DOB: June 15, 1965   55 y.o. Female  MRN: [id number] Visit Date: 04/15/2021  Today's healthcare provider: Lelon Huh, MD   Chief Complaint  Patient presents with   Urinary Tract Infection    Subjective    Urinary Tract Infection  This is a new problem. The current episode started in the past 7 days. The problem has been gradually worsening. There has been no fever. Associated symptoms include chills, flank pain, frequency, hematuria and urgency.   No nausea or vomiting. Started having a little pain left side of back today.    Medications: Outpatient Medications Prior to Visit  Medication Sig   aspirin EC 81 MG tablet Take by mouth.   BIOTIN PO Take by mouth.   Calcium-Vitamin D-Vitamin K (VIACTIV PO) Take by mouth.   cholecalciferol (VITAMIN D3) 25 MCG (1000 UT) tablet Take 1,000 Units by mouth daily.   Multiple Vitamins-Minerals (CENTRUM SILVER PO) Take by mouth.   OVER THE COUNTER MEDICATION Instaflex.  1 tab daily   phentermine (ADIPEX-P) 37.5 MG tablet Take 37.5 mg by mouth daily.   traZODone (DESYREL) 50 MG tablet Take 0.5-1 tablets (25-50 mg total) by mouth at bedtime as needed for sleep.   valACYclovir (VALTREX) 1000 MG tablet TAKE 2 TABLETS (2,000 MG TOTAL) BY MOUTH 2 (TWO) TIMES DAILY. AS NEEDED   No facility-administered medications prior to visit.    Review of Systems  Constitutional:  Positive for chills. Negative for activity change, appetite change, diaphoresis, fatigue, fever and unexpected weight change.  Gastrointestinal: Negative.   Genitourinary:  Positive for flank pain, frequency, hematuria and urgency. Negative for difficulty urinating, dysuria, vaginal bleeding, vaginal discharge and vaginal pain.      Objective    BP 133/87 (BP Location: Right Arm, Patient Position: Sitting, Cuff Size: Normal)   Pulse (!) 116   Temp 98 F (36.7 C) (Oral)   Wt 131 lb (59.4 kg)   LMP 12/10/2011   SpO2  100%   BMI 26.46 kg/m    Physical Exam   General appearance:  Well developed, well nourished female, cooperative and in no acute distress Head: Normocephalic, without obvious abnormality, atraumatic Respiratory: Respirations even and unlabored, normal respiratory rate Extremities: All extremities are intact.  Skin: Skin color, texture, turgor normal. No rashes seen  Psych: Appropriate mood and affect. Neurologic: Mental status: Alert, oriented to person, place, and time, thought content appropriate.   Results for orders placed or performed in visit on 04/15/21  POCT urinalysis dipstick  Result Value Ref Range   Color, UA     Clarity, UA     Glucose, UA Negative Negative   Bilirubin, UA Negative    Ketones, UA Negative    Spec Grav, UA >=1.030 (A) 1.010 - 1.025   Blood, UA Large    pH, UA 5.0 5.0 - 8.0   Protein, UA Positive (A) Negative   Urobilinogen, UA 0.2 0.2 or 1.0 E.U./dL   Nitrite, UA Positive    Leukocytes, UA Moderate (2+) (A) Negative   Appearance     Odor      Assessment & Plan     1. Dysuria   2. Cystitis  - Urine Culture - ciprofloxacin (CIPRO) 500 MG tablet; Take 1 tablet (500 mg total) by mouth 2 (two) times daily for 7 days.  Dispense: 14 tablet; Refill: 0    Call if symptoms change or if not rapidly improving.  The entirety of the information documented in the History of Present Illness, Review of Systems and Physical Exam were personally obtained by me. Portions of this information were initially documented by the CMA and reviewed by me for thoroughness and accuracy.     Marcia Lepera, MD  [facility] [phone] (phone) [phone] (fax)  [facility]  

## 2021-04-18 LAB — URINE CULTURE

## 2021-04-18 LAB — SPECIMEN STATUS REPORT

## 2021-05-02 ENCOUNTER — Other Ambulatory Visit: Payer: Self-pay | Admitting: Family Medicine

## 2021-05-02 NOTE — Telephone Encounter (Signed)
Requested Prescriptions  Pending Prescriptions Disp Refills   traZODone (DESYREL) 50 MG tablet [Pharmacy Med Name: TRAZODONE 50 MG TABLET] 30 tablet 2    Sig: TAKE 0.5-1 TABLETS BY MOUTH AT BEDTIME AS NEEDED FOR SLEEP.     Psychiatry: Antidepressants - Serotonin Modulator Passed - 05/02/2021  1:35 AM      Passed - Valid encounter within last 6 months    Recent Outpatient Visits          2 weeks ago [place], Donald E, MD   1 month ago Insomnia, unspecified type   [facility] Tally Joe T, FNP   4 months ago Insomnia, unspecified type   [facility], Dionne Bucy, MD   9 months ago Annual physical exam   [facility], Clearnce Sorrel, [place]   1 year ago Annual physical exam   [facility] Mar Daring, [place]

## 2021-06-10 ENCOUNTER — Other Ambulatory Visit: Payer: Self-pay | Admitting: Family Medicine

## 2021-06-10 DIAGNOSIS — Z1231 Encounter for screening mammogram for malignant neoplasm of breast: Secondary | ICD-10-CM

## 2021-06-23 ENCOUNTER — Other Ambulatory Visit: Payer: Self-pay | Admitting: General Surgery

## 2021-06-23 DIAGNOSIS — Z853 Personal history of malignant neoplasm of breast: Secondary | ICD-10-CM

## 2021-07-25 ENCOUNTER — Other Ambulatory Visit: Payer: Self-pay

## 2021-07-25 ENCOUNTER — Ambulatory Visit
Admission: RE | Admit: 2021-07-25 | Discharge: 2021-07-25 | Disposition: A | Discharge status: Home / Self Care | Payer: 59 | Source: Ambulatory Visit | Attending: Family Medicine | Admitting: Family Medicine

## 2021-07-25 DIAGNOSIS — Z1231 Encounter for screening mammogram for malignant neoplasm of breast: Secondary | ICD-10-CM | POA: Diagnosis present

## 2021-08-04 ENCOUNTER — Other Ambulatory Visit: Payer: Self-pay | Admitting: Physician Assistant

## 2021-08-04 ENCOUNTER — Other Ambulatory Visit: Payer: Self-pay | Admitting: General Surgery

## 2021-08-04 DIAGNOSIS — B001 Herpesviral vesicular dermatitis: Secondary | ICD-10-CM

## 2021-08-04 DIAGNOSIS — M8580 Other specified disorders of bone density and structure, unspecified site: Secondary | ICD-10-CM

## 2021-08-06 ENCOUNTER — Encounter: Payer: BC Managed Care – PPO | Admitting: Family Medicine

## 2021-08-08 ENCOUNTER — Other Ambulatory Visit (HOSPITAL_COMMUNITY)
Admission: RE | Admit: 2021-08-08 | Discharge: 2021-08-08 | Disposition: A | Discharge status: Home / Self Care | Payer: 59 | Source: Ambulatory Visit | Attending: Family Medicine | Admitting: Family Medicine

## 2021-08-08 ENCOUNTER — Encounter: Payer: Self-pay | Admitting: Family Medicine

## 2021-08-08 ENCOUNTER — Ambulatory Visit (INDEPENDENT_AMBULATORY_CARE_PROVIDER_SITE_OTHER): Payer: 59 | Admitting: Family Medicine

## 2021-08-08 ENCOUNTER — Other Ambulatory Visit: Payer: Self-pay | Admitting: Family Medicine

## 2021-08-08 VITALS — BP 120/81 | HR 96 | Temp 97.9°F | Ht 59.0 in | Wt 137.2 lb

## 2021-08-08 DIAGNOSIS — Z124 Encounter for screening for malignant neoplasm of cervix: Secondary | ICD-10-CM | POA: Insufficient documentation

## 2021-08-08 DIAGNOSIS — Z713 Dietary counseling and surveillance: Secondary | ICD-10-CM | POA: Diagnosis not present

## 2021-08-08 DIAGNOSIS — G4700 Insomnia, unspecified: Secondary | ICD-10-CM

## 2021-08-08 DIAGNOSIS — Z0000 Encounter for general adult medical examination without abnormal findings: Secondary | ICD-10-CM | POA: Diagnosis not present

## 2021-08-08 MED ORDER — PHENTERMINE HCL 37.5 MG PO TABS: 37.5000 mg | ORAL_TABLET | Freq: Every day | ORAL | 0 refills | Status: DC

## 2021-08-08 MED ORDER — TRAZODONE HCL 50 MG PO TABS: 50.0000 mg | ORAL_TABLET | Freq: Every evening | ORAL | 1 refills | Status: DC | PRN

## 2021-08-08 NOTE — Assessment & Plan Note (Signed)
Denies vaginal complaints. Due for cervical cancer screening. Discussed screening guidelines based on age and previous results. Discussed co-testing options with HPV. ? ?

## 2021-08-08 NOTE — Progress Notes (Signed)
? ? ?I,Dawn Rivas,acting as a scribe for Dawn Sprout, FNP.,have documented all relevant documentation on the behalf of Dawn Sprout, FNP,as directed by  Dawn Sprout, FNP while in the presence of Dawn Sprout, FNP.  ? ? ?Complete physical exam ? ? ?Patient: Dawn Rivas   DOB: 1966/03/23   56 y.o. Female  MRN: [id number] ?Visit Date: 08/08/2021 ? ?Today's healthcare provider: Gwyneth Sprout, FNP  ? ?Chief Complaint  ?Patient presents with  ? Annual Exam  ? ?Subjective  ?  ?Dawn Rivas is a 56 y.o. female who presents today for a complete physical exam.  ?She reports consuming a general diet. Gym/ health club routine includes light weights and treadmill. She generally feels fairly well. She reports sleeping fairly well. She does have additional problems to discuss today.  ?HPI  ? ? ?Past Medical History:  ?Diagnosis Date  ? Breast cancer ([place]) 07/10/2013  ? T2, N1, ER/PR less than 5%, HER-2 negative.  Neoadjuvant chemotherapy. yPT1a,N0 post neoadjuvant treatment.  ? Cancer ([place]) 2015  ? Invasive mammary carcinoma left breast, 2 microscopic foci residual tumor: 3.5 mm and one 0.0 mm respectively. Sentinel nodes negative x3. Closest margin 4 mm. Definitive affective neoadjuvant chemotherapy identified both of the breast sentinel lymph node. YyPT1a, N0.  ER 5%, PR 5%, HER-2/neu is not amplified.  ? Personal history of chemotherapy   ? Personal history of radiation therapy   ? ?Past Surgical History:  ?Procedure Laterality Date  ? AUGMENTATION MAMMAPLASTY Bilateral   ? breast implants  ? BREAST BIOPSY Left 2015  ? Left breast, 2 microscopic foci residual tumor: 3.5 mm and one 0.0 mm respectively. Sentinel nodes negative x3. Closest margin 4 mm. Definitive affective neoadjuvant chemotherapy identified both of the breast sentinel lymph node. YyPT1a, N0.  ER 5%, PR 5%, HER-2/neu is not amplified.  ? BREAST BIOPSY Left 07/2020  ? BREAST BIOPSY Left 07/2020  ? U/S guided bx by Dr Noni Saupe lump bx'd benign  per pt  ? BREAST ENHANCEMENT SURGERY  7 years ago  ? BREAST LUMPECTOMY Left 01/05/2014  ? Wide excision, mastoplasty, sentinel node biopsy. F/U Radiation and Chemo  ? COLONOSCOPY  08/01/2014  ? Normal, repeat 2026.  ? KNEE ARTHROSCOPY WITH MEDIAL MENISECTOMY Left 04/05/2019  ? Procedure: KNEE ARTHROSCOPY WITH DEBRIDEMENT, DECOMPRESSION, REPAIR VERSUS PARTIAL MEDIAL MENISECTOMY, AND POSSIBLE LATERAL RELEASE;  Surgeon: Corky Mull, MD;  Location: [facility];  Service: Orthopedics;  Laterality: Left;  ? PORTACATH PLACEMENT  07/24/2013  ? TUBAL LIGATION  1997  ? UPPER GI ENDOSCOPY  09/28/2013  ? hiatus hernia, la grade a reflux esophagitis, normal stomach, normal duodenum  ? ?Social History  ? ?Socioeconomic History  ? Marital status: Married  ?  Spouse name: Not on file  ? Number of children: 3  ? Years of education: Not on file  ? Highest education level: Not on file  ?Occupational History  ?  Employer: [employer]  ?Tobacco Use  ? Smoking status: Never  ? Smokeless tobacco: Never  ?Vaping Use  ? Vaping Use: Never used  ?Substance and Sexual Activity  ? Alcohol use: Yes  ?  Comment: drinks 1 x/month  ? Drug use: No  ? Sexual activity: Not on file  ?Other Topics Concern  ? Not on file  ?Social History Narrative  ? Not on file  ? ?Social Determinants of Health  ? ?Financial Resource Strain: Not on file  ?Food Insecurity: Not on file  ?  Transportation Needs: Not on file  ?Physical Activity: Not on file  ?Stress: Not on file  ?Social Connections: Not on file  ?Intimate Partner Violence: Not on file  ? ?Family Status  ?Relation Name Status  ? Mother  Alive  ? MGF  Deceased at age 60  ? Father  Alive  ? Sister  Alive  ? Brother  Alive  ? Son  Alive  ? MGM  Alive  ? PGM  Deceased at age 73  ? PGF  Deceased at age 40  ? Son  Alive  ? Son  Alive  ? Mat Aunt  (Not Specified)  ? ?Family History  ?Problem Relation Age of Onset  ? Cervical cancer Mother   ? Uterine cancer Mother   ? Lung cancer Maternal Grandfather    ? Heart disease Father   ? Heart attack Father   ? Stroke Paternal Grandmother   ? Alzheimer's disease Paternal Grandmother   ? Heart attack Paternal Grandfather   ? Breast cancer Maternal Aunt   ? ?Allergies  ?Allergen Reactions  ? Elemental Sulfur Hives  ? Penicillins Hives  ?  ?Patient Care Team: ?Dawn Sprout, FNP as PCP - General (Family Medicine) ?Robert Bellow, MD (General Surgery) ?Lloyd Huger, MD as Consulting Physician (Oncology)  ? ?Medications: ?Outpatient Medications Prior to Visit  ?Medication Sig  ? aspirin EC 81 MG tablet Take by mouth.  ? BIOTIN PO Take by mouth.  ? Calcium-Vitamin D-Vitamin K (VIACTIV PO) Take by mouth.  ? cholecalciferol (VITAMIN D3) 25 MCG (1000 UT) tablet Take 1,000 Units by mouth daily.  ? Multiple Vitamins-Minerals (CENTRUM SILVER PO) Take by mouth.  ? OVER THE COUNTER MEDICATION Instaflex.  1 tab daily  ? valACYclovir (VALTREX) 1000 MG tablet TAKE 2 TABLETS (2,000 MG TOTAL) BY MOUTH 2 (TWO) TIMES DAILY. AS NEEDED  ? [DISCONTINUED] phentermine (ADIPEX-P) 37.5 MG tablet Take 37.5 mg by mouth daily.  ? [DISCONTINUED] traZODone (DESYREL) 50 MG tablet TAKE 0.5-1 TABLETS BY MOUTH AT BEDTIME AS NEEDED FOR SLEEP.  ? ?No facility-administered medications prior to visit.  ? ? ?Review of Systems  ?Constitutional:  Negative for chills, fatigue and fever.  ?HENT:  Negative for congestion, ear pain, rhinorrhea, sneezing and sore throat.   ?Eyes: Negative.  Negative for pain and redness.  ?Respiratory:  Negative for cough, shortness of breath and wheezing.   ?Cardiovascular:  Negative for chest pain and leg swelling.  ?Gastrointestinal:  Negative for abdominal pain, blood in stool, constipation, diarrhea and nausea.  ?Endocrine: Negative for polydipsia and polyphagia.  ?Genitourinary: Negative.  Negative for dysuria, flank pain, hematuria, pelvic pain, vaginal bleeding and vaginal discharge.  ?Musculoskeletal:  Negative for arthralgias, back pain, gait problem and joint  swelling.  ?Skin:  Negative for rash.  ?Neurological: Negative.  Negative for dizziness, tremors, seizures, weakness, light-headedness, numbness and headaches.  ?Hematological:  Negative for adenopathy.  ?Psychiatric/Behavioral: Negative.  Negative for behavioral problems, confusion and dysphoric mood. The patient is not nervous/anxious and is not hyperactive.   ? ? ? Objective  ?  ?BP 120/81 (BP Location: Right Arm)   Pulse 96   Temp 97.9 ?F (36.6 ?C) (Oral)   Ht _0  (1.499 m)   Wt 137 lb 3.2 oz (62.2 kg)   LMP 12/10/2011   SpO2 99% Comment: room air  BMI 27.71 kg/m?  ? ? ? ?Physical Exam ?Vitals and nursing note reviewed. Exam conducted with a chaperone present.  ?Constitutional:   ?  General: She is awake. She is not in acute distress. ?   Appearance: Normal appearance. She is well-developed, well-groomed and overweight. She is not ill-appearing, toxic-appearing or diaphoretic.  ?HENT:  ?   Head: Normocephalic and atraumatic.  ?   Jaw: There is normal jaw occlusion. No trismus, tenderness, swelling or pain on movement.  ?   Right Ear: Hearing, tympanic membrane, ear canal and external ear normal. There is no impacted cerumen.  ?   Left Ear: Hearing, tympanic membrane, ear canal and external ear normal. There is no impacted cerumen.  ?   Nose: Nose normal. No congestion or rhinorrhea.  ?   Right Turbinates: Not enlarged, swollen or pale.  ?   Left Turbinates: Not enlarged, swollen or pale.  ?   Right Sinus: No maxillary sinus tenderness or frontal sinus tenderness.  ?   Left Sinus: No maxillary sinus tenderness or frontal sinus tenderness.  ?   Mouth/Throat:  ?   Lips: Pink.  ?   Mouth: Mucous membranes are moist. No injury.  ?   Tongue: No lesions.  ?   Pharynx: Oropharynx is clear. Uvula midline. No pharyngeal swelling, oropharyngeal exudate, posterior oropharyngeal erythema or uvula swelling.  ?   Tonsils: No tonsillar exudate or tonsillar abscesses.  ?Eyes:  ?   General: Lids are normal. Lids are  everted, no foreign bodies appreciated. Vision grossly intact. Gaze aligned appropriately. No allergic shiner or visual field deficit.    ?   Right eye: No discharge.     ?   Left eye: No discharge.  ?   Shea Stakes

## 2021-08-08 NOTE — Assessment & Plan Note (Signed)
Acute on chronic concern; weight is up 6# ?Body mass index is 27.71 kg/m?. ?Has been off phentermine; last Rx from 02/2021 ?Wishes to restart for 90 days ?Discussed risk of side effects ?Patient continues to proceed with medication ?RTC in 3 months for f/u ?

## 2021-08-08 NOTE — Assessment & Plan Note (Signed)
UTD on dental ?UTD on vision ?Was seen by specialist for mammo ?PAP completed today ?Things to do to keep yourself healthy  ?- Exercise at least 30-45 minutes a day, 3-4 days a week.  ?- Eat a low-fat diet with lots of fruits and vegetables, up to 7-9 servings per day.  ?- Seatbelts can save your life. Wear them always.  ?- Smoke detectors on every level of your home, check batteries every year.  ?- Eye Doctor - have an eye exam every 1-2 years  ?- Safe sex - if you may be exposed to STDs, use a condom.  ?- Alcohol -  If you drink, do it moderately, less than 2 drinks per day.  ?- [place]. Choose someone to speak for you if you are not able.  ?- Depression is common in our stressful world.If you're feeling down or losing interest in things you normally enjoy, please come in for a visit.  ?- Violence - If anyone is threatening or hurting you, please call immediately. ? ? ?

## 2021-08-08 NOTE — Assessment & Plan Note (Signed)
Chronic, stable ?Request refill of trazodone ?Has been working well ? ?

## 2021-08-09 LAB — CBC WITH DIFFERENTIAL/PLATELET
Basophils Absolute: 0 10*3/uL (ref 0.0–0.2)
Basos: 0 %
EOS (ABSOLUTE): 0.1 10*3/uL (ref 0.0–0.4)
Eos: 2 %
Hematocrit: 42.2 % (ref 34.0–46.6)
Hemoglobin: 14.6 g/dL (ref 11.1–15.9)
Immature Grans (Abs): 0 10*3/uL (ref 0.0–0.1)
Immature Granulocytes: 0 %
Lymphocytes Absolute: 2.3 10*3/uL (ref 0.7–3.1)
Lymphs: 48 %
MCH: 31.7 pg (ref 26.6–33.0)
MCHC: 34.6 g/dL (ref 31.5–35.7)
MCV: 92 fL (ref 79–97)
Monocytes Absolute: 0.4 10*3/uL (ref 0.1–0.9)
Monocytes: 8 %
Neutrophils Absolute: 2 10*3/uL (ref 1.4–7.0)
Neutrophils: 42 %
Platelets: 224 10*3/uL (ref 150–450)
RBC: 4.6 x10E6/uL (ref 3.77–5.28)
RDW: 12.1 % (ref 11.7–15.4)
WBC: 4.8 10*3/uL (ref 3.4–10.8)

## 2021-08-09 LAB — COMPREHENSIVE METABOLIC PANEL
ALT: 19 IU/L (ref 0–32)
AST: 19 IU/L (ref 0–40)
Albumin/Globulin Ratio: 1.9 (ref 1.2–2.2)
Albumin: 4.7 g/dL (ref 3.8–4.9)
Alkaline Phosphatase: 47 IU/L (ref 44–121)
BUN/Creatinine Ratio: 25 — ABNORMAL HIGH (ref 9–23)
BUN: 19 mg/dL (ref 6–24)
Bilirubin Total: 0.5 mg/dL (ref 0.0–1.2)
CO2: 26 mmol/L (ref 20–29)
Calcium: 9.6 mg/dL (ref 8.7–10.2)
Chloride: 101 mmol/L (ref 96–106)
Creatinine, Ser: 0.76 mg/dL (ref 0.57–1.00)
Globulin, Total: 2.5 g/dL (ref 1.5–4.5)
Glucose: 90 mg/dL (ref 70–99)
Potassium: 4.4 mmol/L (ref 3.5–5.2)
Sodium: 143 mmol/L (ref 134–144)
Total Protein: 7.2 g/dL (ref 6.0–8.5)
eGFR: 91 mL/min/{1.73_m2} (ref >59)

## 2021-08-09 LAB — LIPID PANEL
Chol/HDL Ratio: 3.6 ratio (ref 0.0–4.4)
Cholesterol, Total: 225 mg/dL — ABNORMAL HIGH (ref 100–199)
HDL: 62 mg/dL (ref >39)
LDL Chol Calc (NIH): 141 mg/dL — ABNORMAL HIGH (ref 0–99)
Triglycerides: 125 mg/dL (ref 0–149)
VLDL Cholesterol Cal: 22 mg/dL (ref 5–40)

## 2021-08-09 LAB — T4, FREE: Free T4: 1.18 ng/dL (ref 0.82–1.77)

## 2021-08-09 LAB — TSH: TSH: 2.12 u[IU]/mL (ref 0.450–4.500)

## 2021-08-11 ENCOUNTER — Other Ambulatory Visit: Payer: Self-pay | Admitting: Family Medicine

## 2021-08-11 LAB — CYTOLOGY - PAP
Comment: NEGATIVE
Diagnosis: NEGATIVE
High risk HPV: NEGATIVE

## 2021-09-24 ENCOUNTER — Ambulatory Visit
Admission: RE | Admit: 2021-09-24 | Discharge: 2021-09-24 | Disposition: A | Discharge status: Home / Self Care | Payer: 59 | Source: Ambulatory Visit | Attending: General Surgery | Admitting: General Surgery

## 2021-09-24 DIAGNOSIS — M8580 Other specified disorders of bone density and structure, unspecified site: Secondary | ICD-10-CM | POA: Diagnosis present

## 2021-11-14 ENCOUNTER — Ambulatory Visit: Payer: 59 | Admitting: Physician Assistant

## 2021-11-14 ENCOUNTER — Encounter: Payer: Self-pay | Admitting: Physician Assistant

## 2021-11-14 VITALS — BP 132/88 | HR 88 | Temp 98.2°F | Resp 16 | Wt 138.9 lb

## 2021-11-14 DIAGNOSIS — J029 Acute pharyngitis, unspecified: Secondary | ICD-10-CM | POA: Diagnosis not present

## 2021-11-14 LAB — POCT RAPID STREP A (OFFICE): Rapid Strep A Screen: NEGATIVE

## 2021-11-14 MED ORDER — AZITHROMYCIN 250 MG PO TABS: ORAL_TABLET | ORAL | 0 refills | Status: AC

## 2021-11-14 NOTE — Progress Notes (Signed)
I,Dawn Rivas,acting as a Education administrator for [employer], PA-C.,have documented all relevant documentation on the behalf of Dawn Speak, PA-C,as directed by  [employer], PA-C while in the presence of [employer], PA-C.   Established patient visit   Patient: Dawn Rivas   DOB: Apr 28, 1966   56 y.o. Female  MRN: [id number] Visit Date: 11/14/2021  Today's healthcare provider: Mardene Speak, PA-C   Chief Complaint  Patient presents with   Cough        Sore Throat  Cough / sore throat  Subjective    Upper respiratory symptoms She complains of non productive cough and sore throat.with no fever, chills, night sweats or weight loss. Onset of symptoms was a few days ago and gradually worsening.She is drinking plenty of fluids.  Past history is significant for no history of pneumonia or bronchitis. Patient is non-smoker      Reports she gets these symptoms a couple times per year.  States she usually gets a Z-pak which helps.   ---------------------------------------------------------------------------------------------------    Medications: Outpatient Medications Prior to Visit  Medication Sig   aspirin EC 81 MG tablet Take by mouth.   BIOTIN PO Take by mouth.   Calcium-Vitamin D-Vitamin K (VIACTIV PO) Take by mouth.   cholecalciferol (VITAMIN D3) 25 MCG (1000 UT) tablet Take 1,000 Units by mouth daily.   Multiple Vitamins-Minerals (CENTRUM SILVER PO) Take by mouth.   OVER THE COUNTER MEDICATION Instaflex.  1 tab daily   phentermine (ADIPEX-P) 37.5 MG tablet Take 1 tablet (37.5 mg total) by mouth daily before breakfast.   traZODone (DESYREL) 50 MG tablet Take 1 tablet (50 mg total) by mouth at bedtime as needed for sleep.   valACYclovir (VALTREX) 1000 MG tablet TAKE 2 TABLETS (2,000 MG TOTAL) BY MOUTH 2 (TWO) TIMES DAILY. AS NEEDED   No facility-administered medications prior to visit.    Review of Systems  All other systems reviewed and are negative. See hpi      Objective    BP 132/88 (BP Location: Right Arm, Patient Position: Sitting, Cuff Size: Normal)   Pulse 88   Temp 98.2 F (36.8 C) (Oral)   Resp 16   Wt 138 lb 14.4 oz (63 kg)   LMP 12/10/2011   SpO2 98%   BMI 28.05 kg/m    Physical Exam Vitals reviewed.  Constitutional:      General: She is in acute distress.     Appearance: She is well-developed.  HENT:     Head: Normocephalic and atraumatic.     Right Ear: Tympanic membrane and ear canal normal. No drainage, swelling or tenderness. No middle ear effusion. Tympanic membrane is not erythematous.     Left Ear: Tympanic membrane and ear canal normal. No drainage, swelling or tenderness.  No middle ear effusion. Tympanic membrane is not erythematous.     Nose: Congestion and rhinorrhea present.     Mouth/Throat:     Mouth: No oral lesions.     Pharynx: Pharyngeal swelling and posterior oropharyngeal erythema present. No oropharyngeal exudate or uvula swelling.     Tonsils: No tonsillar abscesses. 1+ on the right. 1+ on the left.  Eyes:     Extraocular Movements:     Right eye: Normal extraocular motion.     Left eye: Normal extraocular motion.     Conjunctiva/sclera: Conjunctivae normal.     Pupils: Pupils are equal, round, and reactive to light.  Cardiovascular:     Rate and Rhythm: Normal  rate and regular rhythm.     Heart sounds: Normal heart sounds.  Pulmonary:     Effort: Pulmonary effort is normal.     Breath sounds: Normal breath sounds.  Musculoskeletal:     Cervical back: Normal range of motion and neck supple.  Lymphadenopathy:     Cervical: No cervical adenopathy.  Skin:    General: Skin is warm.  Neurological:     General: No focal deficit present.     Mental Status: She is alert and oriented to person, place, and time.  Psychiatric:        Mood and Affect: Mood normal.        Behavior: Behavior normal.      No results found for any visits on 11/14/21.  Assessment & Plan     1. Sore throat Could be  due to pharyngitis/tonsillitis Rapid strep test was negative Symptomatic treatment strongly recommended with OTC pain medications, warm salt gargle, nasal saline rinse, flonase, mucinex for cough  - azithromycin (ZITHROMAX) 250 MG tablet; Take 2 tablets on day 1, then 1 tablet daily on days 2 through 5  Dispense: 6 tablet; Refill: 0 Patient was instructed not to fill it unless their symptoms worsen   Discussed the potential negative side effects of antibiotics and that they can lead to resistance if used unnecessarily Discussed expectations for duration of symptoms. Discussed that they may feel bad for up to a week, but sometimes symptoms last longer (especially if you smoke)  Explained that you can get multiple viruses in a row and recommend prevention strategies such as hand washing Made a plan with the patient to outline what to do if symptoms worsen or do not improve, or if they develop concerning symptoms like high fever, SOB The patient was advised to call back or seek an in-person evaluation if the symptoms worsen or if the condition fails to improve as anticipated.  I discussed the assessment and treatment plan with the patient. The patient was provided an opportunity to ask questions and all were answered. The patient agreed with the plan and demonstrated an understanding of the instructions.  The entirety of the information documented in the History of Present Illness, Review of Systems and Physical Exam were personally obtained by me. Portions of this information were initially documented by the CMA and reviewed by me for thoroughness and accuracy.  Portions of this note were created using dictation software and may contain typographical errors.   Dawn Speak, PA-C  [facility] [phone] (phone) [phone] (fax)  [place]

## 2022-02-03 ENCOUNTER — Other Ambulatory Visit: Payer: Self-pay | Admitting: Family Medicine

## 2022-02-03 DIAGNOSIS — G4700 Insomnia, unspecified: Secondary | ICD-10-CM

## 2022-02-23 ENCOUNTER — Other Ambulatory Visit: Payer: Self-pay | Admitting: Family Medicine

## 2022-02-23 DIAGNOSIS — Z713 Dietary counseling and surveillance: Secondary | ICD-10-CM

## 2022-06-08 ENCOUNTER — Ambulatory Visit: Payer: 59 | Admitting: Family Medicine

## 2022-06-08 ENCOUNTER — Encounter: Payer: Self-pay | Admitting: Family Medicine

## 2022-06-08 VITALS — BP 133/88 | HR 99 | Ht 59.0 in | Wt 148.2 lb

## 2022-06-08 DIAGNOSIS — Z23 Encounter for immunization: Secondary | ICD-10-CM

## 2022-06-08 DIAGNOSIS — E663 Overweight: Secondary | ICD-10-CM

## 2022-06-08 DIAGNOSIS — Z713 Dietary counseling and surveillance: Secondary | ICD-10-CM

## 2022-06-08 MED ORDER — PHENTERMINE HCL 37.5 MG PO TABS: 37.5000 mg | ORAL_TABLET | Freq: Every day | ORAL | 0 refills | Status: DC

## 2022-06-08 NOTE — Progress Notes (Signed)
I,Sha'taria Tyson,acting as a Education administrator for Lavon Paganini, MD.,have documented all relevant documentation on the behalf of Lavon Paganini, MD,as directed by  Lavon Paganini, MD while in the presence of Lavon Paganini, MD.   Established patient visit   Patient: Dawn Rivas   DOB: 01-24-1966   57 y.o. Female  MRN: [id number] Visit Date: 06/08/2022  Today's healthcare provider: Lavon Paganini, MD   No chief complaint on file.  Subjective    HPI  -Tetanus vaccine: today, reports big knot last time she received Follow up for weight loss counseling  The patient was last seen for this 9 months ago. Changes made at last visit include restart phentermine for 90 days.  She reports good compliance with treatment. She feels that condition is Unchanged. She is not having side effects.   Has seen weight gain after stopping phentermine  Wt Readings from Last 3 Encounters:  06/08/22 148 lb 3.2 oz (67.2 kg)  11/14/21 138 lb 14.4 oz (63 kg)  08/08/21 137 lb 3.2 oz (62.2 kg)   -----------------------------------------------------------------------------------------   Medications: Outpatient Medications Prior to Visit  Medication Sig   aspirin EC 81 MG tablet Take by mouth.   BIOTIN PO Take by mouth.   Calcium-Vitamin D-Vitamin K (VIACTIV PO) Take by mouth.   cholecalciferol (VITAMIN D3) 25 MCG (1000 UT) tablet Take 1,000 Units by mouth daily.   Multiple Vitamins-Minerals (CENTRUM SILVER PO) Take by mouth.   OVER THE COUNTER MEDICATION Instaflex.  1 tab daily   traZODone (DESYREL) 50 MG tablet TAKE 1 TABLET BY MOUTH AT BEDTIME AS NEEDED FOR SLEEP.   valACYclovir (VALTREX) 1000 MG tablet TAKE 2 TABLETS (2,000 MG TOTAL) BY MOUTH 2 (TWO) TIMES DAILY. AS NEEDED   [DISCONTINUED] phentermine (ADIPEX-P) 37.5 MG tablet Take 1 tablet (37.5 mg total) by mouth daily before breakfast.   No facility-administered medications prior to visit.    Review of Systems per HPI      Objective    BP 133/88 (BP Location: Right Arm, Patient Position: Sitting, Cuff Size: Large)   Pulse 99   Ht '4\' 11"'$  (1.499 m)   Wt 148 lb 3.2 oz (67.2 kg)   LMP 12/10/2011   BMI 29.93 kg/m    Physical Exam Vitals reviewed.  Constitutional:      General: She is not in acute distress.    Appearance: Normal appearance. She is well-developed. She is not diaphoretic.  HENT:     Head: Normocephalic and atraumatic.  Eyes:     General: No scleral icterus.    Conjunctiva/sclera: Conjunctivae normal.  Neck:     Thyroid: No thyromegaly.  Cardiovascular:     Rate and Rhythm: Normal rate and regular rhythm.     Pulses: Normal pulses.     Heart sounds: Normal heart sounds. No murmur heard. Pulmonary:     Effort: Pulmonary effort is normal. No respiratory distress.     Breath sounds: Normal breath sounds. No wheezing, rhonchi or rales.  Musculoskeletal:     Cervical back: Neck supple.     Right lower leg: No edema.     Left lower leg: No edema.  Lymphadenopathy:     Cervical: No cervical adenopathy.  Skin:    General: Skin is warm and dry.     Findings: No rash.  Neurological:     Mental Status: She is alert and oriented to person, place, and time. Mental status is at baseline.  Psychiatric:        Mood  and Affect: Mood normal.        Behavior: Behavior normal.       No results found for any visits on 06/08/22.  Assessment & Plan     Problem List Items Addressed This Visit       Other   Overweight - Primary    Discussed importance of healthy weight management Discussed diet and exercise  Does well with weight loss on Phentermine  Will resume x90d - discussed that she will need to take for only 3 months before drug break      Weight loss counseling, encounter for   Relevant Medications   phentermine (ADIPEX-P) 37.5 MG tablet     Return in about 3 months (around 09/07/2022) for CPE, With PCP.      I, Lavon Paganini, MD, have reviewed all documentation for this  visit. The documentation on 06/08/22 for the exam, diagnosis, procedures, and orders are all accurate and complete.   Kerianne Gurr, Dionne Bucy, MD, MPH [facility]

## 2022-06-08 NOTE — Addendum Note (Signed)
Addended by: Barnie Mort on: 06/08/2022 04:24 PM   Modules accepted: Orders

## 2022-06-08 NOTE — Assessment & Plan Note (Signed)
Discussed importance of healthy weight management Discussed diet and exercise  Does well with weight loss on Phentermine  Will resume x90d - discussed that she will need to take for only 3 months before drug break

## 2022-06-17 ENCOUNTER — Other Ambulatory Visit: Payer: Self-pay | Admitting: Surgery

## 2022-06-17 DIAGNOSIS — Z1231 Encounter for screening mammogram for malignant neoplasm of breast: Secondary | ICD-10-CM

## 2022-07-31 ENCOUNTER — Other Ambulatory Visit: Payer: Self-pay | Admitting: Family Medicine

## 2022-07-31 DIAGNOSIS — G4700 Insomnia, unspecified: Secondary | ICD-10-CM

## 2022-07-31 NOTE — Telephone Encounter (Signed)
Requested Prescriptions  Pending Prescriptions Disp Refills   traZODone (DESYREL) 50 MG tablet [Pharmacy Med Name: TRAZODONE 50 MG TABLET] 90 tablet 1    Sig: TAKE 1 TABLET BY MOUTH EVERY DAY AT BEDTIME AS NEEDED FOR SLEEP     Psychiatry: Antidepressants - Serotonin Modulator Passed - 07/31/2022  2:14 AM      Passed - Valid encounter within last 6 months    Recent Outpatient Visits           1 month ago [place], Dionne Bucy, MD   8 months ago Sore throat   [place], [place], PA-C   11 months ago Annual physical exam   [facility] Gwyneth Sprout, FNP   1 year ago [place], Donald E, MD   1 year ago Insomnia, unspecified type   [facility] Gwyneth Sprout, [place]

## 2022-08-05 ENCOUNTER — Ambulatory Visit
Admission: RE | Admit: 2022-08-05 | Discharge: 2022-08-05 | Disposition: A | Discharge status: Home / Self Care | Payer: 59 | Source: Ambulatory Visit | Attending: Surgery | Admitting: Surgery

## 2022-08-05 DIAGNOSIS — Z1231 Encounter for screening mammogram for malignant neoplasm of breast: Secondary | ICD-10-CM | POA: Diagnosis not present

## 2022-08-06 NOTE — Progress Notes (Unsigned)
I,J'ya E Alrick Cubbage,acting as a scribe for [employer], PA-C.,have documented all relevant documentation on the behalf of Mikey Kirschner, PA-C,as directed by  Mikey Kirschner, PA-C while in the presence of Mikey Kirschner, PA-C.   Established patient visit   Patient: Dawn Rivas   DOB: [birth year]   57 y.o. Female  MRN: [id number] Visit Date: 08/07/2022  Today's healthcare provider: Mikey Kirschner, PA-C   Chief Complaint  Patient presents with   Vaginal Discharge   Subjective    Vaginal Discharge The patient's primary symptoms include vaginal discharge. The patient's pertinent negatives include no genital itching or genital odor. This is a new problem. Episode onset: Started on Sunday. The problem has been gradually improving. The patient is experiencing no pain. She is not pregnant. Associated symptoms include a sore throat. Pertinent negatives include no abdominal pain, back pain, chills, constipation, headaches or nausea. The vaginal discharge was milky and yellow. There has been no bleeding. She has not been passing clots. She has not been passing tissue. She has tried nothing for the symptoms. She is postmenopausal.    Denies urinary symptoms iching new sexual partners. New swab.  Medications: Outpatient Medications Prior to Visit  Medication Sig   aspirin EC 81 MG tablet Take by mouth.   BIOTIN PO Take by mouth.   Calcium-Vitamin D-Vitamin K (VIACTIV PO) Take by mouth.   cholecalciferol (VITAMIN D3) 25 MCG (1000 UT) tablet Take 1,000 Units by mouth daily.   Multiple Vitamins-Minerals (CENTRUM SILVER PO) Take by mouth.   OVER THE COUNTER MEDICATION Instaflex.  1 tab daily   phentermine (ADIPEX-P) 37.5 MG tablet Take 1 tablet (37.5 mg total) by mouth daily before breakfast.   traZODone (DESYREL) 50 MG tablet TAKE 1 TABLET BY MOUTH EVERY DAY AT BEDTIME AS NEEDED FOR SLEEP   valACYclovir (VALTREX) 1000 MG tablet TAKE 2 TABLETS (2,000 MG TOTAL) BY MOUTH 2 (TWO) TIMES DAILY. AS  NEEDED   No facility-administered medications prior to visit.    Review of Systems  Constitutional:  Negative for chills.  HENT:  Positive for sore throat.   Gastrointestinal:  Negative for abdominal pain, constipation and nausea.  Genitourinary:  Positive for vaginal discharge.  Musculoskeletal:  Negative for back pain.  Neurological:  Negative for headaches.    {Labs  Heme  Chem  Endocrine  Serology  Results Review (optional):23779}   Objective    BP 115/79 (BP Location: Right Arm, Patient Position: Sitting, Cuff Size: Large)   Pulse (!) 106   Temp 97.9 F (36.6 C) (Oral)   Ht 4\' 11"  (1.499 m)   Wt 146 lb 8 oz (66.5 kg)   LMP 12/10/2011   BMI 29.59 kg/m  {Show previous vital signs (optional):23777}  Physical Exam Vitals reviewed.  Constitutional:      Appearance: She is not ill-appearing.  HENT:     Head: Normocephalic.  Eyes:     Conjunctiva/sclera: Conjunctivae normal.  Cardiovascular:     Rate and Rhythm: Normal rate.  Pulmonary:     Effort: Pulmonary effort is normal. No respiratory distress.  Neurological:     General: No focal deficit present.     Mental Status: She is alert and oriented to person, place, and time.  Psychiatric:        Mood and Affect: Mood normal.        Behavior: Behavior normal.     ***  No results found for any visits on 08/07/22.  Assessment & Plan     ***  No follow-ups on file.      {provider attestation***:1}   Mikey Kirschner, PA-C  [place] [phone] (phone) [phone] (fax)  [place]

## 2022-08-07 ENCOUNTER — Ambulatory Visit: Payer: 59 | Admitting: Physician Assistant

## 2022-08-07 ENCOUNTER — Encounter: Payer: Self-pay | Admitting: Physician Assistant

## 2022-08-07 VITALS — BP 115/79 | HR 106 | Temp 97.9°F | Ht 59.0 in | Wt 146.5 lb

## 2022-08-07 DIAGNOSIS — N898 Other specified noninflammatory disorders of vagina: Secondary | ICD-10-CM

## 2022-08-10 ENCOUNTER — Encounter: Payer: Self-pay | Admitting: Physician Assistant

## 2022-08-12 LAB — NUSWAB VAGINITIS PLUS (VG+)
Candida albicans, NAA: NEGATIVE
Candida glabrata, NAA: NEGATIVE
Chlamydia trachomatis, NAA: NEGATIVE
Neisseria gonorrhoeae, NAA: NEGATIVE
Trich vag by NAA: NEGATIVE

## 2022-08-19 ENCOUNTER — Ambulatory Visit (INDEPENDENT_AMBULATORY_CARE_PROVIDER_SITE_OTHER): Payer: 59 | Admitting: Family Medicine

## 2022-08-19 ENCOUNTER — Encounter: Payer: Self-pay | Admitting: Family Medicine

## 2022-08-19 VITALS — BP 131/90 | HR 97 | Temp 98.5°F | Ht 59.0 in | Wt 147.0 lb

## 2022-08-19 DIAGNOSIS — Z0000 Encounter for general adult medical examination without abnormal findings: Secondary | ICD-10-CM

## 2022-08-19 NOTE — Assessment & Plan Note (Signed)
Continue to recommend annual vision and dental appointments Things to do to keep yourself healthy  - Exercise at least 30-45 minutes a day, 3-4 days a week.  - Eat a low-fat diet with lots of fruits and vegetables, up to 7-9 servings per day.  - Seatbelts can save your life. Wear them always.  - Smoke detectors on every level of your home, check batteries every year.  - Eye Doctor - have an eye exam every 1-2 years  - Safe sex - if you may be exposed to STDs, use a condom.  - Alcohol -  If you drink, do it moderately, less than 2 drinks per day.  - Health Care Power of Attorney. Choose someone to speak for you if you are not able.  - Depression is common in our stressful world.If you're feeling down or losing interest in things you normally enjoy, please come in for a visit.  - Violence - If anyone is threatening or hurting you, please call immediately.

## 2022-08-19 NOTE — Progress Notes (Signed)
I,J'ya E Hunter,acting as a scribe for Jacky Kindle, FNP.,have documented all relevant documentation on the behalf of Jacky Kindle, FNP,as directed by  Jacky Kindle, FNP while in the presence of Jacky Kindle, FNP.  Complete physical exam  Patient: Dawn Rivas   DOB: Feb 26, 1966   57 y.o. Female  MRN: [id number] Visit Date: 08/19/2022  Today's healthcare provider: Jacky Kindle, FNP  Re Introduced to nurse practitioner role and practice setting.  All questions answered.  Discussed provider/patient relationship and expectations.  Chief Complaint  Patient presents with   Annual Exam   Subjective    Dawn Rivas is a 57 y.o. female who presents today for a complete physical exam.  She reports consuming a general diet. Home exercise routine includes treadmill. Gym/ health club routine includes treadmill. She generally feels well. She reports sleeping well. She does not have additional problems to discuss today.   HPI   Past Medical History:  Diagnosis Date   Breast cancer 07/10/2013   T2, N1, ER/PR less than 5%, HER-2 negative.  Neoadjuvant chemotherapy. yPT1a,N0 post neoadjuvant treatment.   Cancer 2015   Invasive mammary carcinoma left breast, 2 microscopic foci residual tumor: 3.5 mm and one 0.0 mm respectively. Sentinel nodes negative x3. Closest margin 4 mm. Definitive affective neoadjuvant chemotherapy identified both of the breast sentinel lymph node. YyPT1a, N0.  ER 5%, PR 5%, HER-2/neu is not amplified.   Personal history of chemotherapy    Personal history of radiation therapy    Past Surgical History:  Procedure Laterality Date   AUGMENTATION MAMMAPLASTY Bilateral    breast implants   BREAST BIOPSY Left 2015   Left breast, 2 microscopic foci residual tumor: 3.5 mm and one 0.0 mm respectively. Sentinel nodes negative x3. Closest margin 4 mm. Definitive affective neoadjuvant chemotherapy identified both of the breast sentinel lymph node. YyPT1a, N0.  ER 5%, PR 5%,  HER-2/neu is not amplified.   BREAST BIOPSY Left 07/2020   BREAST BIOPSY Left 07/2020   U/S guided bx by Dr Lillia Abed lump bx'd benign per pt   BREAST ENHANCEMENT SURGERY  7 years ago   BREAST LUMPECTOMY Left 01/05/2014   Wide excision, mastoplasty, sentinel node biopsy. F/U Radiation and Chemo   COLONOSCOPY  08/01/2014   Normal, repeat 2026.   KNEE ARTHROSCOPY WITH MEDIAL MENISECTOMY Left 04/05/2019   Procedure: KNEE ARTHROSCOPY WITH DEBRIDEMENT, DECOMPRESSION, REPAIR VERSUS PARTIAL MEDIAL MENISECTOMY, AND POSSIBLE LATERAL RELEASE;  Surgeon: Christena Flake, MD;  Location: [facility];  Service: Orthopedics;  Laterality: Left;   PORTACATH PLACEMENT  07/24/2013   TUBAL LIGATION  1997   UPPER GI ENDOSCOPY  09/28/2013   hiatus hernia, la grade a reflux esophagitis, normal stomach, normal duodenum   Social History   Socioeconomic History   Marital status: Married    Spouse name: Not on file   Number of children: 3   Years of education: Not on file   Highest education level: Not on file  Occupational History    Employer: [employer]  Tobacco Use   Smoking status: Never   Smokeless tobacco: Never  Vaping Use   Vaping Use: Never used  Substance and Sexual Activity   Alcohol use: Yes    Comment: drinks 1 x/month   Drug use: No   Sexual activity: Not on file  Other Topics Concern   Not on file  Social History Narrative   Not on file   Social Determinants of Health  Financial Resource Strain: Not on file  Food Insecurity: Not on file  Transportation Needs: Not on file  Physical Activity: Not on file  Stress: Not on file  Social Connections: Not on file  Intimate Partner Violence: Not on file   Family Status  Relation Name Status   Mother  Alive   MGF  Deceased at age 59   Father  Alive   Sister  Alive   Brother  Alive   Son  Alive   MGM  Alive   PGM  Deceased at age 59   PGF  Deceased at age 59   Son  Alive   Son  Chemical engineerAlive   Mat Aunt  (Not  Specified)   Family History  Problem Relation Age of Onset   Cervical cancer Mother    Uterine cancer Mother    Lung cancer Maternal Grandfather    Heart disease Father    Heart attack Father    Stroke Paternal Grandmother    Alzheimer's disease Paternal Grandmother    Heart attack Paternal Grandfather    Breast cancer Maternal Aunt    Allergies  Allergen Reactions   Elemental Sulfur Hives   Penicillins Hives    Patient Care Team: Jacky KindlePayne, Bryndan Bilyk T, FNP as PCP - General (Family Medicine) Lemar LivingsByrnett, Merrily PewJeffrey W, MD (General Surgery) Jeralyn RuthsFinnegan, Timothy J, MD as Consulting Physician (Oncology)   Medications: Outpatient Medications Prior to Visit  Medication Sig   aspirin EC 81 MG tablet Take by mouth.   BIOTIN PO Take by mouth.   Calcium-Vitamin D-Vitamin K (VIACTIV PO) Take by mouth.   cholecalciferol (VITAMIN D3) 25 MCG (1000 UT) tablet Take 1,000 Units by mouth daily.   Multiple Vitamins-Minerals (CENTRUM SILVER PO) Take by mouth.   OVER THE COUNTER MEDICATION Instaflex.  1 tab daily   phentermine (ADIPEX-P) 37.5 MG tablet Take 1 tablet (37.5 mg total) by mouth daily before breakfast.   traZODone (DESYREL) 50 MG tablet TAKE 1 TABLET BY MOUTH EVERY DAY AT BEDTIME AS NEEDED FOR SLEEP   valACYclovir (VALTREX) 1000 MG tablet TAKE 2 TABLETS (2,000 MG TOTAL) BY MOUTH 2 (TWO) TIMES DAILY. AS NEEDED   No facility-administered medications prior to visit.    Review of Systems   Objective    BP (!) 131/90 (BP Location: Left Arm, Patient Position: Sitting, Cuff Size: Normal)   Pulse 97   Temp 98.5 F (36.9 C) (Oral)   Ht 4\' 11"  (1.499 m)   Wt 147 lb (66.7 kg)   LMP 12/10/2011   SpO2 100%   BMI 29.69 kg/m   Physical Exam Vitals and nursing note reviewed.  Constitutional:      General: She is awake. She is not in acute distress.    Appearance: Normal appearance. She is well-developed, well-groomed and overweight. She is not ill-appearing, toxic-appearing or diaphoretic.  HENT:      Head: Normocephalic and atraumatic.     Jaw: There is normal jaw occlusion. No trismus, tenderness, swelling or pain on movement.     Right Ear: Hearing, tympanic membrane, ear canal and external ear normal. There is no impacted cerumen.     Left Ear: Hearing, tympanic membrane, ear canal and external ear normal. There is no impacted cerumen.     Nose: Nose normal. No congestion or rhinorrhea.     Right Turbinates: Not enlarged, swollen or pale.     Left Turbinates: Not enlarged, swollen or pale.     Right Sinus: No maxillary sinus tenderness or frontal  sinus tenderness.     Left Sinus: No maxillary sinus tenderness or frontal sinus tenderness.     Mouth/Throat:     Lips: Pink.     Mouth: Mucous membranes are moist. No injury.     Tongue: No lesions.     Pharynx: Oropharynx is clear. Uvula midline. No pharyngeal swelling, oropharyngeal exudate, posterior oropharyngeal erythema or uvula swelling.     Tonsils: No tonsillar exudate or tonsillar abscesses.  Eyes:     General: Lids are normal. Lids are everted, no foreign bodies appreciated. Vision grossly intact. Gaze aligned appropriately. No allergic shiner or visual field deficit.       Right eye: No discharge.        Left eye: No discharge.     Extraocular Movements: Extraocular movements intact.     Conjunctiva/sclera: Conjunctivae normal.     Right eye: Right conjunctiva is not injected. No exudate.    Left eye: Left conjunctiva is not injected. No exudate.    Pupils: Pupils are equal, round, and reactive to light.  Neck:     Thyroid: No thyroid mass, thyromegaly or thyroid tenderness.     Vascular: No carotid bruit.     Trachea: Trachea normal.  Cardiovascular:     Rate and Rhythm: Normal rate and regular rhythm.     Pulses: Normal pulses.          Carotid pulses are 2+ on the right side and 2+ on the left side.      Radial pulses are 2+ on the right side and 2+ on the left side.       Dorsalis pedis pulses are 2+ on the  right side and 2+ on the left side.       Posterior tibial pulses are 2+ on the right side and 2+ on the left side.     Heart sounds: Normal heart sounds, S1 normal and S2 normal. No murmur heard.    No friction rub. No gallop.  Pulmonary:     Effort: Pulmonary effort is normal. No respiratory distress.     Breath sounds: Normal breath sounds and air entry. No stridor. No wheezing, rhonchi or rales.  Chest:     Chest wall: No tenderness.  Abdominal:     General: Abdomen is flat. Bowel sounds are normal. There is no distension.     Palpations: Abdomen is soft. There is no mass.     Tenderness: There is no abdominal tenderness. There is no right CVA tenderness, left CVA tenderness, guarding or rebound.     Hernia: No hernia is present.  Genitourinary:    Comments: Exam deferred; denies complaints Musculoskeletal:        General: No swelling, tenderness, deformity or signs of injury. Normal range of motion.     Cervical back: Full passive range of motion without pain, normal range of motion and neck supple. No edema, rigidity or tenderness. No muscular tenderness.     Right lower leg: No edema.     Left lower leg: No edema.  Lymphadenopathy:     Cervical: No cervical adenopathy.     Right cervical: No superficial, deep or posterior cervical adenopathy.    Left cervical: No superficial, deep or posterior cervical adenopathy.  Skin:    General: Skin is warm and dry.     Capillary Refill: Capillary refill takes less than 2 seconds.     Coloration: Skin is not jaundiced or pale.     Findings: No bruising, erythema,  lesion or rash.  Neurological:     General: No focal deficit present.     Mental Status: She is alert and oriented to person, place, and time. Mental status is at baseline.     GCS: GCS eye subscore is 4. GCS verbal subscore is 5. GCS motor subscore is 6.     Sensory: Sensation is intact. No sensory deficit.     Motor: Motor function is intact. No weakness.     Coordination:  Coordination is intact. Coordination normal.     Gait: Gait is intact. Gait normal.  Psychiatric:        Attention and Perception: Attention and perception normal.        Mood and Affect: Mood and affect normal.        Speech: Speech normal.        Behavior: Behavior normal. Behavior is cooperative.        Thought Content: Thought content normal.        Cognition and Memory: Cognition and memory normal.        Judgment: Judgment normal.     Last depression screening scores    08/19/2022    3:26 PM 08/07/2022   10:00 AM 08/08/2021    8:51 AM  PHQ 2/9 Scores  PHQ - 2 Score 0 0 0  PHQ- 9 Score 0 0 0   Last fall risk screening    08/19/2022    3:26 PM  Fall Risk   Falls in the past year? 0  Number falls in past yr: 0  Injury with Fall? 0  Risk for fall due to : No Fall Risks   Last Audit-C alcohol use screening    08/19/2022    3:26 PM  Alcohol Use Disorder Test (AUDIT)  2. How many drinks containing alcohol do you have on a typical day when you are drinking? 0  3. How often do you have six or more drinks on one occasion? 0   A score of 3 or more in women, and 4 or more in men indicates increased risk for alcohol abuse, EXCEPT if all of the points are from question 1   No results found for any visits on 08/19/22.  Assessment & Plan    Routine Health Maintenance and Physical Exam  Exercise Activities and Dietary recommendations  Goals   None     Immunization History  Administered Date(s) Administered   Influenza Inj Mdck Quad With Preservative 02/14/2018   Influenza,inj,Quad PF,6+ Mos 02/14/2017, 12/29/2018, 03/05/2021, 01/21/2022   PFIZER(Purple Top)SARS-COV-2 Vaccination 08/02/2019, 08/30/2019, 03/04/2020   Td 06/08/2022   Tdap 01/07/2012   Zoster Recombinat (Shingrix) 09/25/2017, 12/10/2017    Health Maintenance  Topic Date Due   COVID-19 Vaccine (4 - 2023-24 season) 01/09/2022   INFLUENZA VACCINE  12/10/2022   MAMMOGRAM  08/05/2023   COLONOSCOPY (Pts  45-74yrs Insurance coverage will need to be confirmed)  07/31/2024   PAP SMEAR-Modifier  08/09/2026   DTaP/Tdap/Td (3 - Td or Tdap) 06/08/2032   Hepatitis C Screening  Completed   HIV Screening  Completed   Zoster Vaccines- Shingrix  Completed   HPV VACCINES  Aged Out    Discussed health benefits of physical activity, and encouraged her to engage in regular exercise appropriate for her age and condition.  Problem List Items Addressed This Visit       Other   Annual physical exam - Primary    Continue to recommend annual vision and dental appointments Things to do to  keep yourself healthy  - Exercise at least 30-45 minutes a day, 3-4 days a week.  - Eat a low-fat diet with lots of fruits and vegetables, up to 7-9 servings per day.  - Seatbelts can save your life. Wear them always.  - Smoke detectors on every level of your home, check batteries every year.  - Eye Doctor - have an eye exam every 1-2 years  - Safe sex - if you may be exposed to STDs, use a condom.  - Alcohol -  If you drink, do it moderately, less than 2 drinks per day.  - Health Care Power of Attorney. Choose someone to speak for you if you are not able.  - Depression is common in our stressful world.If you're feeling down or losing interest in things you normally enjoy, please come in for a visit.  - Violence - If anyone is threatening or hurting you, please call immediately.       Relevant Orders   CBC with Differential/Platelet   Comprehensive Metabolic Panel (CMET)   Lipid panel   Return in about 1 year (around 08/19/2023) for annual examination.    Leilani Merl, FNP, have reviewed all documentation for this visit. The documentation on 08/19/22 for the exam, diagnosis, procedures, and orders are all accurate and complete.  Jacky Kindle, FNP  [facility] [phone] (phone) [phone] (fax)  [facility]

## 2022-12-07 ENCOUNTER — Other Ambulatory Visit: Payer: Self-pay | Admitting: Family Medicine

## 2022-12-07 DIAGNOSIS — B001 Herpesviral vesicular dermatitis: Secondary | ICD-10-CM

## 2022-12-08 NOTE — Telephone Encounter (Signed)
Requested medication (s) are due for refill today: expired medication   Requested medication (s) are on the active medication list: yes   Last refill:  08/05/21 #16 5 refills   Future visit scheduled: no  last seen 3 months ago   Notes to clinic:  expired medication . Do you want to renew Rx?     Requested Prescriptions  Pending Prescriptions Disp Refills   valACYclovir (VALTREX) 1000 MG tablet [Pharmacy Med Name: VALACYCLOVIR HCL 1 GRAM TABLET] 16 tablet 5    Sig: TAKE 2 TABLETS (2,000 MG TOTAL) BY MOUTH 2 (TWO) TIMES DAILY. AS NEEDED     Antimicrobials:  Antiviral Agents - Anti-Herpetic Passed - 12/07/2022 12:02 PM      Passed - Valid encounter within last 12 months    Recent Outpatient Visits           3 months ago Annual physical exam   [facility] Jacky Kindle, FNP   4 months ago Vaginal discharge   [facility] Alfredia Ferguson, PA-C   6 months ago Overweight   [facility] [place], Marzella Schlein, MD   1 year ago Sore throat   [facility] [place], [place], PA-C   1 year ago Annual physical exam   [facility] Jacky Kindle, [place]

## 2023-01-27 ENCOUNTER — Other Ambulatory Visit: Payer: Self-pay | Admitting: Family Medicine

## 2023-01-27 DIAGNOSIS — G4700 Insomnia, unspecified: Secondary | ICD-10-CM

## 2023-05-28 ENCOUNTER — Other Ambulatory Visit: Payer: Self-pay | Admitting: Surgery

## 2023-05-28 DIAGNOSIS — Z1231 Encounter for screening mammogram for malignant neoplasm of breast: Secondary | ICD-10-CM

## 2023-08-05 ENCOUNTER — Other Ambulatory Visit: Payer: Self-pay

## 2023-08-05 ENCOUNTER — Telehealth: Payer: Self-pay | Admitting: Family Medicine

## 2023-08-05 DIAGNOSIS — G4700 Insomnia, unspecified: Secondary | ICD-10-CM

## 2023-08-05 NOTE — Telephone Encounter (Signed)
[facility]armacy faxed refill request for the following medications:  traZODone (DESYREL) 50 MG tablet   Please advise.  

## 2023-08-05 NOTE — Telephone Encounter (Signed)
 Converted to RF Req

## 2023-08-06 ENCOUNTER — Ambulatory Visit
Admission: RE | Admit: 2023-08-06 | Discharge: 2023-08-06 | Disposition: A | Discharge status: Home / Self Care | Payer: 59 | Source: Ambulatory Visit | Attending: Surgery | Admitting: Surgery

## 2023-08-06 ENCOUNTER — Other Ambulatory Visit: Payer: Self-pay | Admitting: Surgery

## 2023-08-06 DIAGNOSIS — Z1231 Encounter for screening mammogram for malignant neoplasm of breast: Secondary | ICD-10-CM | POA: Diagnosis present

## 2023-08-21 ENCOUNTER — Other Ambulatory Visit: Payer: Self-pay | Admitting: Medical Genetics

## 2023-08-26 ENCOUNTER — Other Ambulatory Visit
Admission: RE | Admit: 2023-08-26 | Discharge: 2023-08-26 | Disposition: A | Discharge status: Home / Self Care | Source: Ambulatory Visit | Attending: Physician Assistant | Admitting: Physician Assistant

## 2023-08-26 ENCOUNTER — Encounter: Payer: Self-pay | Admitting: Physician Assistant

## 2023-08-26 ENCOUNTER — Other Ambulatory Visit
Admission: RE | Admit: 2023-08-26 | Discharge: 2023-08-26 | Disposition: A | Discharge status: Home / Self Care | Payer: Self-pay | Source: Ambulatory Visit | Attending: Medical Genetics | Admitting: Medical Genetics

## 2023-08-26 ENCOUNTER — Ambulatory Visit: Admitting: Physician Assistant

## 2023-08-26 VITALS — BP 131/88 | HR 92 | Resp 16 | Ht 59.0 in | Wt 155.2 lb

## 2023-08-26 DIAGNOSIS — Z0000 Encounter for general adult medical examination without abnormal findings: Secondary | ICD-10-CM

## 2023-08-26 DIAGNOSIS — E1159 Type 2 diabetes mellitus with other circulatory complications: Secondary | ICD-10-CM

## 2023-08-26 DIAGNOSIS — E66811 Obesity, class 1: Secondary | ICD-10-CM | POA: Diagnosis not present

## 2023-08-26 DIAGNOSIS — Z0001 Encounter for general adult medical examination with abnormal findings: Secondary | ICD-10-CM | POA: Diagnosis not present

## 2023-08-26 DIAGNOSIS — I10 Essential (primary) hypertension: Secondary | ICD-10-CM

## 2023-08-26 DIAGNOSIS — E7849 Other hyperlipidemia: Secondary | ICD-10-CM | POA: Diagnosis not present

## 2023-08-26 DIAGNOSIS — R1319 Other dysphagia: Secondary | ICD-10-CM | POA: Diagnosis not present

## 2023-08-26 LAB — CBC WITH DIFFERENTIAL/PLATELET
Abs Immature Granulocytes: 0.01 10*3/uL (ref 0.00–0.07)
Basophils Absolute: 0 10*3/uL (ref 0.0–0.1)
Basophils Relative: 1 %
Eosinophils Absolute: 0.1 10*3/uL (ref 0.0–0.5)
Eosinophils Relative: 2 %
HCT: 41.3 % (ref 36.0–46.0)
Hemoglobin: 14.9 g/dL (ref 12.0–15.0)
Immature Granulocytes: 0 %
Lymphocytes Relative: 43 %
Lymphs Abs: 2.4 10*3/uL (ref 0.7–4.0)
MCH: 32.2 pg (ref 26.0–34.0)
MCHC: 36.1 g/dL — ABNORMAL HIGH (ref 30.0–36.0)
MCV: 89.2 fL (ref 80.0–100.0)
Monocytes Absolute: 0.4 10*3/uL (ref 0.1–1.0)
Monocytes Relative: 7 %
Neutro Abs: 2.6 10*3/uL (ref 1.7–7.7)
Neutrophils Relative %: 47 %
Platelets: 240 10*3/uL (ref 150–400)
RBC: 4.63 MIL/uL (ref 3.87–5.11)
RDW: 12 % (ref 11.5–15.5)
WBC: 5.5 10*3/uL (ref 4.0–10.5)
nRBC: 0 % (ref 0.0–0.2)

## 2023-08-26 LAB — COMPREHENSIVE METABOLIC PANEL WITH GFR
ALT: 48 U/L — ABNORMAL HIGH (ref 0–44)
AST: 35 U/L (ref 15–41)
Albumin: 4.4 g/dL (ref 3.5–5.0)
Alkaline Phosphatase: 37 U/L — ABNORMAL LOW (ref 38–126)
Anion gap: 9 (ref 5–15)
BUN: 16 mg/dL (ref 6–20)
CO2: 25 mmol/L (ref 22–32)
Calcium: 9.3 mg/dL (ref 8.9–10.3)
Chloride: 104 mmol/L (ref 98–111)
Creatinine, Ser: 0.85 mg/dL (ref 0.44–1.00)
GFR, Estimated: >60 mL/min (ref >60)
Glucose, Bld: 108 mg/dL — ABNORMAL HIGH (ref 70–99)
Potassium: 4 mmol/L (ref 3.5–5.1)
Sodium: 138 mmol/L (ref 135–145)
Total Bilirubin: 0.9 mg/dL (ref 0.0–1.2)
Total Protein: 7.6 g/dL (ref 6.5–8.1)

## 2023-08-26 LAB — HEMOGLOBIN A1C
Hgb A1c MFr Bld: 4.8 % (ref 4.8–5.6)
Mean Plasma Glucose: 91.06 mg/dL

## 2023-08-26 LAB — LIPID PANEL
Cholesterol: 244 mg/dL — ABNORMAL HIGH (ref 0–200)
HDL: 56 mg/dL (ref >40)
LDL Cholesterol: 162 mg/dL — ABNORMAL HIGH (ref 0–99)
Total CHOL/HDL Ratio: 4.4 ratio
Triglycerides: 128 mg/dL (ref <150)
VLDL: 26 mg/dL (ref 0–40)

## 2023-08-26 LAB — TSH: TSH: 1.672 u[IU]/mL (ref 0.350–4.500)

## 2023-08-26 MED ORDER — LISINOPRIL 5 MG PO TABS: 5.0000 mg | ORAL_TABLET | Freq: Every day | ORAL | 3 refills | Status: AC

## 2023-08-26 NOTE — Progress Notes (Signed)
 Complete physical exam  Patient: Dawn Rivas   DOB: Jul 02, 1965   58 y.o. Female  MRN: [id number] Visit Date: 08/26/2023  Today's healthcare provider: Blane Bunting, PA-C   Chief Complaint  Patient presents with   Annual Exam    CPE, reflux and wt loss concerns   Subjective    Dawn Rivas is a 58 y.o. female who presents today for a complete physical exam.   Discussed the use of AI scribe software for clinical note transcription with the patient, who gave verbal consent to proceed.  History of Present Illness The patient, with a history of chemotherapy, presents with difficulty swallowing solid foods for about a month. She describes the sensation as if the food is getting stuck. This issue is primarily with solid foods and pills. The patient also reports occasional heartburn. She was previously on omeprazole  during her chemotherapy treatment.  In addition to the swallowing issue, the patient expresses concern about her weight. She reports regular exercise, doing two miles a day, but admits to eating whatever she wants. She was previously on phentermine  for weight loss for about three months. Recently, she started a ketone diet about a month ago.  The patient's blood pressure is noted to be slightly high during the visit, but she is not currently on any medication for it. She does not report any family history of hypertension but does mention that her father had a heart attack in his forties.    Last depression screening scores    08/26/2023   10:54 AM 08/19/2022    3:26 PM 08/07/2022   10:00 AM  PHQ 2/9 Scores  PHQ - 2 Score 0 0 0  PHQ- 9 Score 1 0 0   Last fall risk screening    08/19/2022    3:26 PM  Fall Risk   Falls in the past year? 0  Number falls in past yr: 0  Injury with Fall? 0  Risk for fall due to : No Fall Risks   Last Audit-C alcohol use screening    08/19/2022    3:26 PM  Alcohol Use Disorder Test (AUDIT)  2. How many drinks containing alcohol do  you have on a typical day when you are drinking? 0  3. How often do you have six or more drinks on one occasion? 0   A score of 3 or more in women, and 4 or more in men indicates increased risk for alcohol abuse, EXCEPT if all of the points are from question 1   Past Medical History:  Diagnosis Date   Breast cancer (HCC) 07/10/2013   T2, N1, ER/PR less than 5%, HER-2 negative.  Neoadjuvant chemotherapy. yPT1a,N0 post neoadjuvant treatment.   Cancer (HCC) 2015   Invasive mammary carcinoma left breast, 2 microscopic foci residual tumor: 3.5 mm and one 0.0 mm respectively. Sentinel nodes negative x3. Closest margin 4 mm. Definitive affective neoadjuvant chemotherapy identified both of the breast sentinel lymph node. YyPT1a, N0.  ER 5%, PR 5%, HER-2/neu is not amplified.   Personal history of chemotherapy    Personal history of radiation therapy    Past Surgical History:  Procedure Laterality Date   AUGMENTATION MAMMAPLASTY Bilateral    breast implants   BREAST BIOPSY Left 2015   Left breast, 2 microscopic foci residual tumor: 3.5 mm and one 0.0 mm respectively. Sentinel nodes negative x3. Closest margin 4 mm. Definitive affective neoadjuvant chemotherapy identified both of the breast sentinel lymph node. YyPT1a, N0.  ER 5%, PR 5%, HER-2/neu is not amplified.   BREAST BIOPSY Left 07/2020   BREAST BIOPSY Left 07/2020   U/S guided bx by Dr Mallory Seaman lump bx'd benign per pt   BREAST ENHANCEMENT SURGERY  7 years ago   BREAST LUMPECTOMY Left 01/05/2014   Wide excision, mastoplasty, sentinel node biopsy. F/U Radiation and Chemo   COLONOSCOPY  08/01/2014   Normal, repeat 2026.   KNEE ARTHROSCOPY WITH MEDIAL MENISECTOMY Left 04/05/2019   Procedure: KNEE ARTHROSCOPY WITH DEBRIDEMENT, DECOMPRESSION, REPAIR VERSUS PARTIAL MEDIAL MENISECTOMY, AND POSSIBLE LATERAL RELEASE;  Surgeon: Elner Hahn, MD;  Location: [facility];  Service: Orthopedics;  Laterality: Left;   PORTACATH PLACEMENT   07/24/2013   TUBAL LIGATION  1997   UPPER GI ENDOSCOPY  09/28/2013   hiatus hernia, la grade a reflux esophagitis, normal stomach, normal duodenum   Social History   Socioeconomic History   Marital status: Married    Spouse name: Not on file   Number of children: 3   Years of education: Not on file   Highest education level: Not on file  Occupational History    Employer: [employer]  Tobacco Use   Smoking status: Never   Smokeless tobacco: Never  Vaping Use   Vaping status: Never Used  Substance and Sexual Activity   Alcohol use: Yes    Comment: drinks 1 x/month   Drug use: No   Sexual activity: Not on file  Other Topics Concern   Not on file  Social History Narrative   Not on file   Social Drivers of Health   Financial Resource Strain: Not on file  Food Insecurity: Not on file  Transportation Needs: Not on file  Physical Activity: Not on file  Stress: Not on file  Social Connections: Not on file  Intimate Partner Violence: Not on file   Family Status  Relation Name Status   Mother  Alive   MGF  Deceased at age 88   Father  Alive   Sister  Alive   Brother  Alive   Son  Alive   MGM  Alive   PGM  Deceased at age 40   PGF  Deceased at age 20   Son  Alive   Son  Chemical engineer  (Not Specified)  No partnership data on file   Family History  Problem Relation Age of Onset   Cervical cancer Mother    Uterine cancer Mother    Lung cancer Maternal Grandfather    Heart disease Father    Heart attack Father    Stroke Paternal Grandmother    Alzheimer's disease Paternal Grandmother    Heart attack Paternal Grandfather    Breast cancer Maternal Aunt    Allergies  Allergen Reactions   Elemental Sulfur Hives   Penicillins Hives    Patient Care Team: Feather Berrie, PA-C as PCP - General (Physician Assistant) Marquita Situ, Magali Schmitz, MD (General Surgery) Shellie Dials, MD as Consulting Physician (Oncology)   Medications: Outpatient Medications  Prior to Visit  Medication Sig   aspirin EC 81 MG tablet Take by mouth.   BIOTIN PO Take by mouth.   Calcium-Vitamin D -Vitamin K (VIACTIV PO) Take by mouth.   cholecalciferol (VITAMIN D3) 25 MCG (1000 UT) tablet Take 1,000 Units by mouth daily.   Multiple Vitamins-Minerals (CENTRUM SILVER PO) Take by mouth.   OVER THE COUNTER MEDICATION Instaflex.  1 tab daily   phentermine  (ADIPEX-P ) 37.5 MG tablet Take 1 tablet (  37.5 mg total) by mouth daily before breakfast.   traZODone  (DESYREL ) 50 MG tablet TAKE 1 TABLET BY MOUTH AT BEDTIME AS NEEDED FOR SLEEP   valACYclovir  (VALTREX ) 1000 MG tablet TAKE 2 TABLETS (2,000 MG TOTAL) BY MOUTH 2 (TWO) TIMES DAILY. AS NEEDED   No facility-administered medications prior to visit.    Review of Systems Except see HPI     Objective    BP 131/88 (BP Location: Right Arm, Patient Position: Sitting, Cuff Size: Normal)   Pulse (S) 92   Resp 16   Ht 4\' 11"  (1.499 m)   Wt 155 lb 3.2 oz (70.4 kg)   LMP 12/10/2011   SpO2 98%   BMI 31.35 kg/m      Physical Exam Vitals reviewed.  Constitutional:      General: She is not in acute distress.    Appearance: Normal appearance. She is well-developed. She is not ill-appearing, toxic-appearing or diaphoretic.  HENT:     Head: Normocephalic and atraumatic.     Right Ear: Tympanic membrane, ear canal and external ear normal.     Left Ear: Tympanic membrane, ear canal and external ear normal.     Nose: Nose normal. No congestion or rhinorrhea.     Mouth/Throat:     Mouth: Mucous membranes are moist.     Pharynx: Oropharynx is clear. No oropharyngeal exudate.  Eyes:     General: No scleral icterus.       Right eye: No discharge.        Left eye: No discharge.     Conjunctiva/sclera: Conjunctivae normal.     Pupils: Pupils are equal, round, and reactive to light.  Neck:     Thyroid : No thyromegaly.     Vascular: No carotid bruit.  Cardiovascular:     Rate and Rhythm: Normal rate and regular rhythm.      Pulses: Normal pulses.     Heart sounds: Normal heart sounds. No murmur heard.    No friction rub. No gallop.  Pulmonary:     Effort: Pulmonary effort is normal. No respiratory distress.     Breath sounds: Normal breath sounds. No wheezing or rales.  Abdominal:     General: Abdomen is flat. Bowel sounds are normal. There is no distension.     Palpations: Abdomen is soft. There is no mass.     Tenderness: There is no abdominal tenderness. There is no right CVA tenderness, left CVA tenderness, guarding or rebound.     Hernia: No hernia is present.  Musculoskeletal:        General: No swelling, tenderness, deformity or signs of injury. Normal range of motion.     Cervical back: Normal range of motion and neck supple. No rigidity or tenderness.     Right lower leg: No edema.     Left lower leg: No edema.  Lymphadenopathy:     Cervical: No cervical adenopathy.  Skin:    General: Skin is warm and dry.     Coloration: Skin is not jaundiced or pale.     Findings: No bruising, erythema, lesion or rash.  Neurological:     Mental Status: She is alert and oriented to person, place, and time. Mental status is at baseline.     Gait: Gait normal.  Psychiatric:        Mood and Affect: Mood normal.        Behavior: Behavior normal.        Thought Content: Thought content normal.  Judgment: Judgment normal.    Breast and pelvic exams deferred  Results for orders placed or performed during the hospital encounter of 08/26/23  TSH  Result Value Ref Range   TSH 1.672 0.350 - 4.500 uIU/mL  Lipid panel  Result Value Ref Range   Cholesterol 244 (H) 0 - 200 mg/dL   Triglycerides 161 <096 mg/dL   HDL 56 >04 mg/dL   Total CHOL/HDL Ratio 4.4 RATIO   VLDL 26 0 - 40 mg/dL   LDL Cholesterol 540 (H) 0 - 99 mg/dL  Hemoglobin J8J  Result Value Ref Range   Hgb A1c MFr Bld 4.8 4.8 - 5.6 %   Mean Plasma Glucose 91.06 mg/dL  Comprehensive metabolic panel  Result Value Ref Range   Sodium 138 135 -  145 mmol/L   Potassium 4.0 3.5 - 5.1 mmol/L   Chloride 104 98 - 111 mmol/L   CO2 25 22 - 32 mmol/L   Glucose, Bld 108 (H) 70 - 99 mg/dL   BUN 16 6 - 20 mg/dL   Creatinine, Ser 1.91 0.44 - 1.00 mg/dL   Calcium 9.3 8.9 - 47.8 mg/dL   Total Protein 7.6 6.5 - 8.1 g/dL   Albumin 4.4 3.5 - 5.0 g/dL   AST 35 15 - 41 U/L   ALT 48 (H) 0 - 44 U/L   Alkaline Phosphatase 37 (L) 38 - 126 U/L   Total Bilirubin 0.9 0.0 - 1.2 mg/dL   GFR, Estimated >29 >56 mL/min   Anion gap 9 5 - 15  CBC with Differential/Platelet  Result Value Ref Range   WBC 5.5 4.0 - 10.5 K/uL   RBC 4.63 3.87 - 5.11 MIL/uL   Hemoglobin 14.9 12.0 - 15.0 g/dL   HCT 21.3 08.6 - 57.8 %   MCV 89.2 80.0 - 100.0 fL   MCH 32.2 26.0 - 34.0 pg   MCHC 36.1 (H) 30.0 - 36.0 g/dL   RDW 46.9 62.9 - 52.8 %   Platelets 240 150 - 400 K/uL   nRBC 0.0 0.0 - 0.2 %   Neutrophils Relative % 47 %   Neutro Abs 2.6 1.7 - 7.7 K/uL   Lymphocytes Relative 43 %   Lymphs Abs 2.4 0.7 - 4.0 K/uL   Monocytes Relative 7 %   Monocytes Absolute 0.4 0.1 - 1.0 K/uL   Eosinophils Relative 2 %   Eosinophils Absolute 0.1 0.0 - 0.5 K/uL   Basophils Relative 1 %   Basophils Absolute 0.0 0.0 - 0.1 K/uL   Immature Granulocytes 0 %   Abs Immature Granulocytes 0.01 0.00 - 0.07 K/uL    Assessment & Plan    Routine Health Maintenance and Physical Exam  Exercise Activities and Dietary recommendations  Goals   None     Immunization History  Administered Date(s) Administered   Influenza Inj Mdck Quad With Preservative 02/14/2018   Influenza,inj,Quad PF,6+ Mos 02/14/2017, 12/29/2018, 03/05/2021, 01/21/2022   PFIZER(Purple Top)SARS-COV-2 Vaccination 08/02/2019, 08/30/2019, 03/04/2020   Td 06/08/2022   Tdap 01/07/2012   Zoster Recombinant(Shingrix) 09/25/2017, 12/10/2017    Health Maintenance  Topic Date Due   Yearly kidney health urinalysis for diabetes  Never done   COVID-19 Vaccine (4 - 2024-25 season) 01/10/2023   Flu Shot  12/10/2023   Colon  Cancer Screening  07/31/2024   Mammogram  08/05/2024   Yearly kidney function blood test for diabetes  08/25/2024   Pap with HPV screening  08/09/2026   DTaP/Tdap/Td vaccine (3 - Td or Tdap) 06/08/2032  Hepatitis C Screening  Completed   HIV Screening  Completed   Zoster (Shingles) Vaccine  Completed   HPV Vaccine  Aged Out   Meningitis B Vaccine  Aged Out    Discussed health benefits of physical activity, and encouraged her to engage in regular exercise appropriate for her age and condition.  Assessment & Plan Dysphagia X 1 month Difficulty with solids suggests esophageal stricture or motility disorder. Requires gastroenterology evaluation. - Refer to gastroenterology for evaluation.  Hypertension Newly diagnosed Elevated blood pressure likely due to stress and weight. Discussed risks of uncontrolled hypertension and benefits of weight loss. Recommended antihypertensive medication initiation. - Prescribe lisinopril  5 mg, start with half a tablet to assess tolerance. - Instruct to monitor blood pressure daily, morning and evening. Lifestyle modification advised Will follow-up  Obesity Chronic, associated with hypertension and hyperlipidemia  interested in weight management. Current efforts include exercise and ketogenic diet.  She has been exercising "all her life life ."  Continue blood pressure is blood pressure fluctuating and are already normal at 134/ Discussed cholesterol medicine but pt declined for now - Document dietary and exercise efforts for potential weight loss medication coverage. - Advise on dietary modifications: low sodium, low cholesterol, low carb diet. Continue ketodiet - Encourage continuation of regular exercise.  Hyperlipidemia Contributes to cardiovascular risk. No recent lipid panel available. Discussed importance of lipid management. - Order lipid panel as part of blood work.  General Health Maintenance Up to date with dental and eye exams.  Discussed importance of regular health maintenance. - Perform blood work to assess overall health status.  Annual Physical Exam UTD on dental/eye Things to do to keep yourself healthy  - Exercise at least 30-45 minutes a day, 3-4 days a week.  - Eat a low-fat diet with lots of fruits and vegetables, up to 7-9 servings per day.  - Seatbelts can save your life. Wear them always.  - Smoke detectors on every level of your home, check batteries every year.  - Eye Doctor - have an eye exam every 1-2 years  - Safe sex - if you may be exposed to STDs, use a condom.  - Alcohol -  If you drink, do it moderately, less than 2 drinks per day.  - Health Care Power of Attorney. Choose someone to speak for you if you are not able.  - Depression is common in our stressful world.If you're feeling down or losing interest in things you normally enjoy, please come in for a visit.  - Violence - If anyone is threatening or hurting you, please call immediately.   No follow-ups on file.    The patient was advised to call back or seek an in-person evaluation if the symptoms worsen or if the condition fails to improve as anticipated.  I discussed the assessment and treatment plan with the patient. The patient was provided an opportunity to ask questions and all were answered. The patient agreed with the plan and demonstrated an understanding of the instructions.  I, Chia Rock, PA-C have reviewed all documentation for this visit. The documentation on 08/26/2023  for the exam, diagnosis, procedures, and orders are all accurate and complete.  Blane Bunting, [facility], MMS [facility] [phone] (phone) [phone] (fax)  [facility]

## 2023-08-28 DIAGNOSIS — E7849 Other hyperlipidemia: Secondary | ICD-10-CM | POA: Insufficient documentation

## 2023-08-28 DIAGNOSIS — R1319 Other dysphagia: Secondary | ICD-10-CM | POA: Insufficient documentation

## 2023-08-28 DIAGNOSIS — E66811 Obesity, class 1: Secondary | ICD-10-CM | POA: Insufficient documentation

## 2023-08-28 DIAGNOSIS — I10 Essential (primary) hypertension: Secondary | ICD-10-CM | POA: Insufficient documentation

## 2023-08-31 ENCOUNTER — Encounter: Payer: Self-pay | Admitting: Physician Assistant

## 2023-09-03 ENCOUNTER — Telehealth: Payer: Self-pay | Admitting: Physician Assistant

## 2023-09-03 NOTE — Telephone Encounter (Signed)
 Copied from CRM [id number]. Topic: General - Other >> Sep 03, 2023  3:07 PM Geneva B wrote: Reason for CRM: patient is calling she was referred to a gastro dr and has an appt in 01/2024 and she was told that she needs to take omeprazole  (PRILOSEC before her 01/2024 appt with gastro so she asking if she can get an rx for that

## 2023-09-06 ENCOUNTER — Encounter: Payer: Self-pay | Admitting: Physician Assistant

## 2023-09-06 ENCOUNTER — Other Ambulatory Visit: Payer: Self-pay | Admitting: Physician Assistant

## 2023-09-06 DIAGNOSIS — K219 Gastro-esophageal reflux disease without esophagitis: Secondary | ICD-10-CM

## 2023-09-06 MED ORDER — OMEPRAZOLE 20 MG PO CPDR: 20.0000 mg | DELAYED_RELEASE_CAPSULE | Freq: Every day | ORAL | 3 refills | Status: DC

## 2023-09-07 LAB — GENECONNECT MOLECULAR SCREEN: Genetic Analysis Overall Interpretation: NEGATIVE

## 2023-11-25 ENCOUNTER — Ambulatory Visit (INDEPENDENT_AMBULATORY_CARE_PROVIDER_SITE_OTHER): Admitting: Physician Assistant

## 2023-11-25 ENCOUNTER — Encounter: Payer: Self-pay | Admitting: Physician Assistant

## 2023-11-25 VITALS — BP 114/75 | HR 74 | Resp 16 | Ht 59.0 in | Wt 156.0 lb

## 2023-11-25 DIAGNOSIS — E7849 Other hyperlipidemia: Secondary | ICD-10-CM | POA: Diagnosis not present

## 2023-11-25 DIAGNOSIS — E66811 Obesity, class 1: Secondary | ICD-10-CM | POA: Diagnosis not present

## 2023-11-25 DIAGNOSIS — R1319 Other dysphagia: Secondary | ICD-10-CM

## 2023-11-25 DIAGNOSIS — K219 Gastro-esophageal reflux disease without esophagitis: Secondary | ICD-10-CM | POA: Diagnosis not present

## 2023-11-25 DIAGNOSIS — I10 Essential (primary) hypertension: Secondary | ICD-10-CM

## 2023-11-25 NOTE — Progress Notes (Signed)
 Established patient visit  Patient: Dawn Rivas   DOB: [birth year]   58 y.o. Female  MRN: [id number] Visit Date: 11/25/2023  Today's healthcare provider: Jolynn Spencer, PA-C   Chief Complaint  Patient presents with   Follow-up    3 month f/u Pt wants to discuss wt lose meds   Subjective     HPI     Follow-up    Additional comments: 3 month f/u Pt wants to discuss wt lose meds      Last edited by Marylen Odella CROME, CMA on 11/25/2023  8:56 AM.       Discussed the use of AI scribe software for clinical note transcription with the patient, who gave verbal consent to proceed.  History of Present Illness Dawn Rivas is a 58 year old female with multiple cardiac problems who presents for a follow-up visit.  Hypertension, follow-up  BP Readings from Last 3 Encounters:  11/25/23 114/75  08/26/23 131/88  08/19/22 (!) 131/90   Wt Readings from Last 3 Encounters:  11/25/23 156 lb (70.8 kg)  08/26/23 155 lb 3.2 oz (70.4 kg)  08/19/22 147 lb (66.7 kg)     She was last seen for hypertension 3 months ago.  BP at that visit was 131/88. Management since that visit includes lisinopril  5mg  daily.  She reports fair compliance with treatment. She is not having side effects.  She is following a Low Sodium diet. She does not smoke. Symptoms: No chest pain No chest pressure  No palpitations No syncope  No dyspnea No orthopnea  No paroxysmal nocturnal dyspnea No lower extremity edema   Pertinent labs Lab Results  Component Value Date   CHOL 244 (H) 08/26/2023   HDL 56 08/26/2023   LDLCALC 162 (H) 08/26/2023   TRIG 128 08/26/2023   CHOLHDL 4.4 08/26/2023   Lab Results  Component Value Date   NA 138 08/26/2023   K 4.0 08/26/2023   CREATININE 0.85 08/26/2023   GFRNONAA >60 08/26/2023   GLUCOSE 108 (H) 08/26/2023   TSH 1.672 08/26/2023     The 10-year ASCVD risk score (Arnett DK, et al., 2019) is: 4.1%  She regularly monitors her blood pressure to identify patterns  related to stress or dietary choices, such as consuming salty foods. However, she does not always consider the causes of fluctuations.       08/26/2023   10:54 AM 08/19/2022    3:26 PM 08/07/2022   10:00 AM  Depression screen PHQ 2/9  Decreased Interest 0 0 0  Down, Depressed, Hopeless 0 0 0  PHQ - 2 Score 0 0 0  Altered sleeping 1 0 0  Tired, decreased energy 0 0 0  Change in appetite 0 0 0  Feeling bad or failure about yourself  0 0 0  Trouble concentrating 0 0 0  Moving slowly or fidgety/restless 0 0 0  Suicidal thoughts 0 0 0  PHQ-9 Score 1 0 0  Difficult doing work/chores Not difficult at all Not difficult at all Not difficult at all      08/26/2023   10:55 AM 07/16/2016    9:21 AM  GAD 7 : Generalized Anxiety Score  Nervous, Anxious, on Edge 0 0  Control/stop worrying 0 0  Worry too much - different things 0 0  Trouble relaxing 0 0  Restless 0 0  Easily annoyed or irritable 0 0  Afraid - awful might happen 0 0  Total GAD 7 Score 0 0  Anxiety Difficulty Not  difficult at all Not difficult at all    Medications: Outpatient Medications Prior to Visit  Medication Sig   aspirin EC 81 MG tablet Take by mouth.   BIOTIN PO Take by mouth.   Calcium-Vitamin D -Vitamin K (VIACTIV PO) Take by mouth.   cholecalciferol (VITAMIN D3) 25 MCG (1000 UT) tablet Take 1,000 Units by mouth daily.   lisinopril  (ZESTRIL ) 5 MG tablet Take 1 tablet (5 mg total) by mouth daily.   Multiple Vitamins-Minerals (CENTRUM SILVER PO) Take by mouth.   omeprazole  (PRILOSEC) 20 MG capsule Take 1 capsule (20 mg total) by mouth daily.   OVER THE COUNTER MEDICATION Instaflex.  1 tab daily   phentermine  (ADIPEX-P ) 37.5 MG tablet Take 1 tablet (37.5 mg total) by mouth daily before breakfast.   traZODone  (DESYREL ) 50 MG tablet TAKE 1 TABLET BY MOUTH AT BEDTIME AS NEEDED FOR SLEEP   valACYclovir  (VALTREX ) 1000 MG tablet TAKE 2 TABLETS (2,000 MG TOTAL) BY MOUTH 2 (TWO) TIMES DAILY. AS NEEDED   No  facility-administered medications prior to visit.    Review of Systems All negative Except see HPI       Objective    BP 114/75 (BP Location: Left Arm, Patient Position: Sitting, Cuff Size: Normal)   Pulse 74   Resp 16   Ht 4' 11 (1.499 m)   Wt 156 lb (70.8 kg)   LMP 12/10/2011   SpO2 100%   BMI 31.51 kg/m     Physical Exam Vitals reviewed.  Constitutional:      General: She is not in acute distress.    Appearance: Normal appearance. She is well-developed. She is not diaphoretic.  HENT:     Head: Normocephalic and atraumatic.  Eyes:     General: No scleral icterus.    Conjunctiva/sclera: Conjunctivae normal.  Neck:     Thyroid : No thyromegaly.  Cardiovascular:     Rate and Rhythm: Normal rate and regular rhythm.     Pulses: Normal pulses.     Heart sounds: Normal heart sounds. No murmur heard. Pulmonary:     Effort: Pulmonary effort is normal. No respiratory distress.     Breath sounds: Normal breath sounds. No wheezing, rhonchi or rales.  Musculoskeletal:     Cervical back: Neck supple.     Right lower leg: No edema.     Left lower leg: No edema.  Lymphadenopathy:     Cervical: No cervical adenopathy.  Skin:    General: Skin is warm and dry.     Findings: No rash.  Neurological:     Mental Status: She is alert and oriented to person, place, and time. Mental status is at baseline.  Psychiatric:        Mood and Affect: Mood normal.        Behavior: Behavior normal.      No results found for any visits on 11/25/23.      Assessment & Plan  Hypertension Chronic and stable Hypertension managed with lifestyle modifications, including low-salt diet and regular exercise. Advised to monitor blood pressure for triggers. - Adhere to low-salt diet. - Engage in regular exercise. - Monitor blood pressure regularly. - Maintain adequate hydration. Continue taking lisinopril  5 Will follow-up  Hyperlipidemia Chronic Last LDL from 08/26/23 was 162 Not  currently on medication The 10-year ASCVD risk score (Arnett DK, et al., 2019) is: 4.1% Advised low cholesterol diet and exercise Consider cholesterol medication if pt agrees Will follow-up  Gastroesophageal reflux disease without esophagitis (Primary) Chronic Continue  PPI/prilosec Elevate the head of the bed 6-8 inches, avoid recumbency for 3 hours after eating, avoid food as a delayed gastric emptying, weight loss   Will follow-up  Obesity (BMI 30.0-34.9) Chronic Associated with HTN, HLD Body mass index is 31.51 kg/m. Weight loss of 5% of pt's current weight via healthy diet and daily exercise encouraged. Cannot proceed with phentermine  due to HTN Last labs review showed elevated alkaline phosphatase, liver enzyme Advised to continue with any application for weight loss and daily exercise up to or more weekly  Will follow-up  No orders of the defined types were placed in this encounter.   No follow-ups on file.   The patient was advised to call back or seek an in-person evaluation if the symptoms worsen or if the condition fails to improve as anticipated.  I discussed the assessment and treatment plan with the patient. The patient was provided an opportunity to ask questions and all were answered. The patient agreed with the plan and demonstrated an understanding of the instructions.  I, Braydan Marriott, PA-C have reviewed all documentation for this visit. The documentation on 11/25/2023  for the exam, diagnosis, procedures, and orders are all accurate and complete.  Jolynn Spencer, [facility], MMS [facility] [phone] (phone) [phone] (fax)  [facility]

## 2023-11-28 DIAGNOSIS — K219 Gastro-esophageal reflux disease without esophagitis: Secondary | ICD-10-CM | POA: Insufficient documentation

## 2023-12-02 ENCOUNTER — Other Ambulatory Visit: Payer: Self-pay | Admitting: Physician Assistant

## 2023-12-02 DIAGNOSIS — K219 Gastro-esophageal reflux disease without esophagitis: Secondary | ICD-10-CM

## 2023-12-07 ENCOUNTER — Telehealth: Payer: Self-pay

## 2023-12-07 NOTE — Telephone Encounter (Signed)
 Called pt no answer, lvm to call back. To get more information if pharmacy stated why medication was denied.

## 2023-12-07 NOTE — Telephone Encounter (Signed)
 Copied from CRM [id number]. Topic: Clinical - Prescription Issue >> Dec 07, 2023 12:42 PM Delon DASEN wrote: Reason for CRM: omeprazole  (PRILOSEC) 20 MG capsule- per pharmacy, refill denied- [phone]

## 2024-01-04 ENCOUNTER — Other Ambulatory Visit: Payer: Self-pay | Admitting: Physician Assistant

## 2024-01-04 DIAGNOSIS — K219 Gastro-esophageal reflux disease without esophagitis: Secondary | ICD-10-CM

## 2024-02-25 ENCOUNTER — Ambulatory Visit: Admitting: Physician Assistant

## 2024-03-16 ENCOUNTER — Ambulatory Visit (INDEPENDENT_AMBULATORY_CARE_PROVIDER_SITE_OTHER)

## 2024-03-16 VITALS — BP 109/70 | HR 94 | Resp 16 | Ht 59.0 in | Wt 159.8 lb

## 2024-03-16 DIAGNOSIS — E7849 Other hyperlipidemia: Secondary | ICD-10-CM | POA: Diagnosis not present

## 2024-03-16 DIAGNOSIS — Z23 Encounter for immunization: Secondary | ICD-10-CM

## 2024-03-16 DIAGNOSIS — I10 Essential (primary) hypertension: Secondary | ICD-10-CM

## 2024-03-16 DIAGNOSIS — E66811 Obesity, class 1: Secondary | ICD-10-CM

## 2024-03-16 DIAGNOSIS — G4700 Insomnia, unspecified: Secondary | ICD-10-CM | POA: Diagnosis not present

## 2024-03-16 MED ORDER — TRAZODONE HCL 50 MG PO TABS: 50.0000 mg | ORAL_TABLET | Freq: Every evening | ORAL | 3 refills | Status: AC | PRN

## 2024-03-16 MED ORDER — PHENTERMINE HCL 15 MG PO CAPS: 15.0000 mg | ORAL_CAPSULE | ORAL | 0 refills | Status: AC

## 2024-03-16 NOTE — Progress Notes (Signed)
 New patient visit   Patient: Dawn Rivas   DOB: [birth year]   58 y.o. Female  MRN: [id number] Visit Date: 03/16/2024  Today's healthcare provider: Isaiah DELENA Pepper, MD   Chief Complaint  Patient presents with   Transitions Of Care    TOC/BP/Wt loss   Subjective    Dawn Rivas is a 58 y.o. female who presents today as a new patient to establish care.   Discussed the use of AI scribe software for clinical note transcription with the patient, who gave verbal consent to proceed.  History of Present Illness Dawn Rivas is a 58 year old female who presents with concerns about weight management.  She is concerned about weight gain despite regular exercise and a balanced diet. She exercises at [employer], walking two miles at a pace of twenty-minute miles, five days a week. Her diet includes oatmeal for breakfast and small portions for lunch, such as a small portion of Chinese food that lasted for three meals. Despite these efforts, she has experienced weight gain, which she attributes to age and hormonal changes.  She has a history of breast cancer diagnosed ten years ago, for which she underwent chemotherapy and was on steroids, leading to weight gain. She has since lost the weight gained during treatment. She is unable to take hormone-based treatments due to her cancer history.  She previously tried a GLP-1 patch obtained online, which resulted in a weight gain of four pounds over three weeks, leading her to discontinue its use. She was previously advised against using phentermine  due to her blood pressure, but her blood pressure is now well-controlled with lisinopril , which she takes daily. She has a history of using phentermine  successfully for weight loss in the past, but was advised to take breaks after three months due to cardiovascular concerns. She is currently exploring insurance coverage for other weight loss medications.  Her current medications include lisinopril  for  blood pressure, trazodone  for sleep, biotin, vitamin D , calcium, Prilosec, and Valtrex  as needed for cold sores.  No history of pancreatitis or family history of thyroid  cancer. No current diabetes.    Past Medical History:  Diagnosis Date   Breast cancer (HCC) 07/10/2013   T2, N1, ER/PR less than 5%, HER-2 negative.  Neoadjuvant chemotherapy. yPT1a,N0 post neoadjuvant treatment.   Cancer (HCC) 2015   Invasive mammary carcinoma left breast, 2 microscopic foci residual tumor: 3.5 mm and one 0.0 mm respectively. Sentinel nodes negative x3. Closest margin 4 mm. Definitive affective neoadjuvant chemotherapy identified both of the breast sentinel lymph node. YyPT1a, N0.  ER 5%, PR 5%, HER-2/neu is not amplified.   Personal history of chemotherapy    Personal history of radiation therapy    Past Surgical History:  Procedure Laterality Date   AUGMENTATION MAMMAPLASTY Bilateral    breast implants   BREAST BIOPSY Left 2015   Left breast, 2 microscopic foci residual tumor: 3.5 mm and one 0.0 mm respectively. Sentinel nodes negative x3. Closest margin 4 mm. Definitive affective neoadjuvant chemotherapy identified both of the breast sentinel lymph node. YyPT1a, N0.  ER 5%, PR 5%, HER-2/neu is not amplified.   BREAST BIOPSY Left 07/2020   BREAST BIOPSY Left 07/2020   U/S guided bx by Dr Cristine lump bx'd benign per pt   BREAST ENHANCEMENT SURGERY  7 years ago   BREAST LUMPECTOMY Left 01/05/2014   Wide excision, mastoplasty, sentinel node biopsy. F/U Radiation and Chemo   COLONOSCOPY  08/01/2014   Normal,  repeat 2026.   KNEE ARTHROSCOPY WITH MEDIAL MENISECTOMY Left 04/05/2019   Procedure: KNEE ARTHROSCOPY WITH DEBRIDEMENT, DECOMPRESSION, REPAIR VERSUS PARTIAL MEDIAL MENISECTOMY, AND POSSIBLE LATERAL RELEASE;  Surgeon: Edie Norleen PARAS, MD;  Location: [facility];  Service: Orthopedics;  Laterality: Left;   PORTACATH PLACEMENT  07/24/2013   TUBAL LIGATION  1997   UPPER GI ENDOSCOPY   09/28/2013   hiatus hernia, la grade a reflux esophagitis, normal stomach, normal duodenum   Family Status  Relation Name Status   Mother  Alive   MGF  Deceased at age 38   Father  Alive   Sister  Alive   Brother  Alive   Son  Alive   MGM  Alive   PGM  Deceased at age 42   PGF  Deceased at age 72   Son  Alive   Son  Chemical Engineer  (Not Specified)  No partnership data on file   Family History  Problem Relation Age of Onset   Cervical cancer Mother    Uterine cancer Mother    Lung cancer Maternal Grandfather    Heart disease Father    Heart attack Father    Stroke Paternal Grandmother    Alzheimer's disease Paternal Grandmother    Heart attack Paternal Grandfather    Breast cancer Maternal Aunt    Social History   Socioeconomic History   Marital status: Married    Spouse name: Not on file   Number of children: 3   Years of education: Not on file   Highest education level: Some college, no degree  Occupational History    Employer: [employer]  Tobacco Use   Smoking status: Never   Smokeless tobacco: Never  Vaping Use   Vaping status: Never Used  Substance and Sexual Activity   Alcohol use: Yes    Comment: drinks 1 x/month   Drug use: No   Sexual activity: Not on file  Other Topics Concern   Not on file  Social History Narrative   Not on file   Social Drivers of Health   Financial Resource Strain: Low Risk  (03/13/2024)   Overall Financial Resource Strain (CARDIA)    Difficulty of Paying Living Expenses: Not hard at all  Food Insecurity: No Food Insecurity (03/13/2024)   Hunger Vital Sign    Worried About Running Out of Food in the Last Year: Never true    Ran Out of Food in the Last Year: Never true  Transportation Needs: No Transportation Needs (03/13/2024)   PRAPARE - Administrator, Civil Service (Medical): No    Lack of Transportation (Non-Medical): No  Physical Activity: Sufficiently Active (03/13/2024)   Exercise Vital Sign     Days of Exercise per Week: 5 days    Minutes of Exercise per Session: 60 min  Stress: Stress Concern Present (03/13/2024)   [employer] of Occupational Health - Occupational Stress Questionnaire    Feeling of Stress: To some extent  Social Connections: Socially Integrated (03/13/2024)   Social Connection and Isolation Panel    Frequency of Communication with Friends and Family: More than three times a week    Frequency of Social Gatherings with Friends and Family: Three times a week    Attends Religious Services: More than 4 times per year    Active Member of Clubs or Organizations: Yes    Attends Banker Meetings: More than 4 times per year    Marital Status: Married  Outpatient Medications Prior to Visit  Medication Sig   BIOTIN PO Take by mouth.   Calcium-Vitamin D -Vitamin K (VIACTIV PO) Take by mouth.   cholecalciferol (VITAMIN D3) 25 MCG (1000 UT) tablet Take 1,000 Units by mouth daily.   lisinopril  (ZESTRIL ) 5 MG tablet Take 1 tablet (5 mg total) by mouth daily.   Multiple Vitamins-Minerals (CENTRUM SILVER PO) Take by mouth.   omeprazole  (PRILOSEC) 20 MG capsule TAKE 1 CAPSULE BY MOUTH EVERY DAY   OVER THE COUNTER MEDICATION Instaflex.  1 tab daily   valACYclovir  (VALTREX ) 1000 MG tablet TAKE 2 TABLETS (2,000 MG TOTAL) BY MOUTH 2 (TWO) TIMES DAILY. AS NEEDED   [DISCONTINUED] aspirin EC 81 MG tablet Take by mouth.   [DISCONTINUED] phentermine  (ADIPEX-P ) 37.5 MG tablet Take 1 tablet (37.5 mg total) by mouth daily before breakfast.   [DISCONTINUED] traZODone  (DESYREL ) 50 MG tablet TAKE 1 TABLET BY MOUTH AT BEDTIME AS NEEDED FOR SLEEP   No facility-administered medications prior to visit.   Allergies  Allergen Reactions   Elemental Sulfur Hives   Penicillins Hives    Reviews of Systems as noted in HPI.      Objective    BP 109/70 (BP Location: Right Arm, Patient Position: Sitting, Cuff Size: Normal)   Pulse 94   Resp 16   Ht 4' 11 (1.499 m)   Wt 159  lb 12.8 oz (72.5 kg)   LMP 12/10/2011   SpO2 100%   BMI 32.28 kg/m     Physical Exam Constitutional:      Appearance: Normal appearance.  HENT:     Head: Normocephalic and atraumatic.     Mouth/Throat:     Mouth: Mucous membranes are moist.  Eyes:     Pupils: Pupils are equal, round, and reactive to light.  Cardiovascular:     Rate and Rhythm: Normal rate and regular rhythm.     Heart sounds: Normal heart sounds.  Pulmonary:     Effort: Pulmonary effort is normal.     Breath sounds: Normal breath sounds.  Skin:    General: Skin is warm.  Neurological:     General: No focal deficit present.     Mental Status: She is alert.     Depression Screen    03/16/2024    8:29 AM 11/25/2023    9:16 AM 08/26/2023   10:54 AM 08/19/2022    3:26 PM  PHQ 2/9 Scores  PHQ - 2 Score 0 0 0 0  PHQ- 9 Score 0 0 1 0   No results found for any visits on 03/16/24.  Assessment & Plan      Problem List Items Addressed This Visit       Cardiovascular and Mediastinum   Primary hypertension - Primary     Other   Insomnia   Relevant Medications   traZODone  (DESYREL ) 50 MG tablet   Obesity (BMI 30.0-34.9)   Relevant Medications   phentermine  15 MG capsule   Other hyperlipidemia   Other Visit Diagnoses       Need for vaccination       Relevant Orders   Pneumococcal conjugate vaccine 20-valent (Completed)   Flu vaccine trivalent PF, 6mos and older(Flulaval,Afluria,Fluarix,Fluzone) (Completed)      Assessment & Plan Obesity Difficulty losing weight. Phentermine  effective previously, limited use due to cardiovascular concerns. Discussed GLP-1 receptor agonists, patient prefers phentermine . Insurance coverage for GLP-1 uncertain. No contraindications for GLP-1. - Prescribed phentermine  for three months. - Monitor weight and blood pressure during  phentermine  use. - Reassess weight management in three months. - Investigate insurance coverage for GLP-1  medications.  Hypertension Chronic, controlled with lisinopril . Phentermine  requires monitoring for cardiovascular effects. - Continue lisinopril . - Monitor blood pressure at home.  Hyperlipidemia Last LDL of 162. The 10-year ASCVD risk score (Arnett DK, et al., 2019) is: 3.7%  - Encouraged dietary modifications for cholesterol. - Recheck in April  Insomnia Chronic, controlled. Managed with trazodone .  - Refilled trazodone  prescription.   Return in about 3 months (around 06/16/2024) for Follow Up Weight.      Isaiah DELENA Pepper, MD  [facility] [phone] (phone) [phone] (fax)

## 2024-05-09 ENCOUNTER — Telehealth: Payer: Self-pay

## 2024-05-09 DIAGNOSIS — B001 Herpesviral vesicular dermatitis: Secondary | ICD-10-CM

## 2024-05-09 MED ORDER — VALACYCLOVIR HCL 1 G PO TABS: 2000.0000 mg | ORAL_TABLET | Freq: Two times a day (BID) | ORAL | 5 refills | Status: AC

## 2024-05-09 NOTE — Telephone Encounter (Signed)
[facility] faxed refill request for the following medications:   valACYclovir (VALTREX) 1000 MG tablet   Please advise.  

## 2024-06-20 ENCOUNTER — Ambulatory Visit
